# Patient Record
Sex: Male | Born: 1990
Health system: Southern US, Community
[De-identification: ages and names within clinical notes are randomized; demographics above are authoritative.]

## PROBLEM LIST (undated history)

## (undated) DIAGNOSIS — F840 Autistic disorder: Secondary | ICD-10-CM

---
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
## 2011-03-16 ENCOUNTER — Encounter (HOSPITAL_COMMUNITY): Payer: Medicaid Other | Admitting: Physician Assistant

## 2011-03-16 DIAGNOSIS — F909 Attention-deficit hyperactivity disorder, unspecified type: Secondary | ICD-10-CM

## 2011-03-26 NOTE — H&P (Signed)
NAME:  Adam Nichols              ACCOUNT NO.:  [id number]   MEDICAL RECORD NO.:  [id number]          PATIENT TYPE:  AMB   LOCATION:  DAY                           FACILITY:  [facility]   PHYSICIAN:  Ky Barban, M.D.DATE OF BIRTH:  Jun 06, 1991   DATE OF ADMISSION:  DATE OF DISCHARGE:  LH                                HISTORY & PHYSICAL   CHIEF COMPLAINT:  Urinary incontinence and frequency.   A 19-1/19-year-old child referred to me by Dr. Arita Miss. His main complaint is  that he is having a lot of frequency nocturia and denies any fevers, chills,  gross hematuria. Also has urge incontinence. He is mentally retarded, on  several medications which have been discontinued. Initially after  discontinuation of medicines, there was some improvement, but he is having  the same problem again. Renal ultrasound was done; it was normal. He is  being brought as an outpatient to undergo cystoscope under anesthesia as an  outpatient.   PAST HISTORY:  Otherwise negative.   FAMILY HISTORY:  Negative. He has another brother with the same problem who  is mentally retarded also.   REVIEW OF SYSTEMS:  Unremarkable.   PHYSICAL EXAMINATION:  GENERAL:  Moderately built, not in acute distress. It  is obvious that he is not mentally as developed.  CENTRAL NERVOUS SYSTEM:  Negative.  HEENT:  Negative.  CHEST:  Symmetrical.  HEART:  Regular sinus rhythm.  ABDOMEN:  Soft, flat. Liver, spleen and kidneys are not palpable.  EXTERNAL GENITALIA:  He is uncircumcised. Meatus adequate. Testicles are  normal.   IMPRESSION:  Urinary frequency, urgency.   PLAN:  Cysto under anesthesia as outpatient.       MIJ/MEDQ  D:  06/14/2005  T:  06/14/2005  Job:  [id number]

## 2011-03-26 NOTE — Op Note (Signed)
NAMEBREYLIN, DOM              ACCOUNT NO.:  [id number]   MEDICAL RECORD NO.:  [id number]          PATIENT TYPE:  AMB   LOCATION:  DAY                           FACILITY:  [facility]   PHYSICIAN:  Ky Barban, M.D.DATE OF BIRTH:  06/28/1991   DATE OF PROCEDURE:  06/15/2005  DATE OF DISCHARGE:                                 OPERATIVE REPORT   PREOPERATIVE DIAGNOSIS:  Urinary frequency and incontinence.   POSTOPERATIVE DIAGNOSIS:  Questionable interstitial cystitis.   PROCEDURE:  Cystoscopy.   ANESTHESIA:  General.   PROCEDURE NOTE:  The patient under general anesthesia adequately prepped and  draped in lithotomy position. A #17 cystoscope was introduced under direct  vision. Anterior urethra looks normal. Prostatic urethra is open, but it was  hyperemic. The bladder neck was opened. The bladder is smooth. There was no  evidence of any foreign body, tumor, or inflammation. Both orifices located  at normal site and are normal in shape. The __________ bladder mucosa. There  is questionable interstitial cystitis. The cystoscope was removed. The  patient left the procedure room in satisfactory condition.       MIJ/MEDQ  D:  06/15/2005  T:  06/15/2005  Job:  [id number]

## 2011-06-15 ENCOUNTER — Encounter (HOSPITAL_COMMUNITY): Payer: Medicaid Other | Admitting: Physician Assistant

## 2011-06-15 DIAGNOSIS — F909 Attention-deficit hyperactivity disorder, unspecified type: Secondary | ICD-10-CM

## 2011-09-15 ENCOUNTER — Encounter (HOSPITAL_COMMUNITY): Payer: Medicaid Other | Admitting: Physician Assistant

## 2011-09-27 ENCOUNTER — Other Ambulatory Visit (HOSPITAL_COMMUNITY): Payer: Self-pay

## 2011-09-28 ENCOUNTER — Ambulatory Visit (HOSPITAL_COMMUNITY): Payer: Medicaid Other | Admitting: Physician Assistant

## 2011-11-11 ENCOUNTER — Other Ambulatory Visit (HOSPITAL_COMMUNITY): Payer: Self-pay | Admitting: Physician Assistant

## 2011-11-11 DIAGNOSIS — F848 Other pervasive developmental disorders: Secondary | ICD-10-CM

## 2011-11-11 MED ORDER — OLANZAPINE 20 MG PO TBDP: 20.0000 mg | ORAL_TABLET | Freq: Three times a day (TID) | ORAL | Status: DC | PRN

## 2011-11-24 ENCOUNTER — Ambulatory Visit (INDEPENDENT_AMBULATORY_CARE_PROVIDER_SITE_OTHER): Payer: Medicaid Other | Admitting: Physician Assistant

## 2011-11-24 DIAGNOSIS — F909 Attention-deficit hyperactivity disorder, unspecified type: Secondary | ICD-10-CM

## 2011-11-24 DIAGNOSIS — F848 Other pervasive developmental disorders: Secondary | ICD-10-CM

## 2011-11-24 MED ORDER — DEXMETHYLPHENIDATE HCL ER 10 MG PO CP24: 10.0000 mg | ORAL_CAPSULE | Freq: Every day | ORAL | Status: DC

## 2011-11-30 NOTE — Progress Notes (Signed)
    Health Follow-up Outpatient Visit  Adam Nichols 25-Sep-1991  Date: 11/24/11   Subjective: Adam Nichols was seen with his mother and brother today. Mother reports that overall Adam Nichols has been doing well, but today he has been acting out. She has been giving him less focal and then prescribed, and he seems to be doing well.  There were no vitals filed for this visit.  Mental Status Examination  Appearance: Casual Alert: Yes Attention: poor Cooperative: No Eye Contact: Poor Speech: Absent Psychomotor Activity: Increased Memory/Concentration: Unknown Oriented: Unknown Mood: Anxious and Euthymic Affect: Congruent Thought Processes and Associations: Unknown Fund of Knowledge: Poor Thought Content:  Insight: Poor Judgement: Poor  Diagnosis: ADHD, combined type; autism  Treatment Plan: Lower dose of Focalin XR , continue other medications as prescribed, followup in 3 months.  Stephaniemarie Stoffel, PA

## 2011-12-14 ENCOUNTER — Other Ambulatory Visit (HOSPITAL_COMMUNITY): Payer: Self-pay | Admitting: Psychology

## 2011-12-14 DIAGNOSIS — F5105 Insomnia due to other mental disorder: Secondary | ICD-10-CM

## 2011-12-14 MED ORDER — TRAZODONE HCL 100 MG PO TABS: 100.0000 mg | ORAL_TABLET | Freq: Every day | ORAL | Status: DC

## 2011-12-29 ENCOUNTER — Other Ambulatory Visit (HOSPITAL_COMMUNITY): Payer: Self-pay | Admitting: *Deleted

## 2011-12-29 ENCOUNTER — Other Ambulatory Visit (HOSPITAL_COMMUNITY): Payer: Self-pay | Admitting: Physician Assistant

## 2011-12-29 DIAGNOSIS — F848 Other pervasive developmental disorders: Secondary | ICD-10-CM

## 2011-12-29 MED ORDER — OLANZAPINE 20 MG PO TBDP: 20.0000 mg | ORAL_TABLET | Freq: Three times a day (TID) | ORAL | Status: DC | PRN

## 2012-01-03 ENCOUNTER — Other Ambulatory Visit (HOSPITAL_COMMUNITY): Payer: Self-pay | Admitting: Physician Assistant

## 2012-01-03 DIAGNOSIS — F848 Other pervasive developmental disorders: Secondary | ICD-10-CM

## 2012-01-03 MED ORDER — OLANZAPINE 20 MG PO TBDP: 20.0000 mg | ORAL_TABLET | Freq: Three times a day (TID) | ORAL | Status: DC | PRN

## 2012-01-12 ENCOUNTER — Other Ambulatory Visit (HOSPITAL_COMMUNITY): Payer: Self-pay | Admitting: *Deleted

## 2012-01-12 DIAGNOSIS — F5105 Insomnia due to other mental disorder: Secondary | ICD-10-CM

## 2012-01-12 MED ORDER — TRAZODONE HCL 100 MG PO TABS: 100.0000 mg | ORAL_TABLET | Freq: Every day | ORAL | Status: DC

## 2012-02-03 ENCOUNTER — Other Ambulatory Visit (HOSPITAL_COMMUNITY): Payer: Self-pay | Admitting: *Deleted

## 2012-02-03 DIAGNOSIS — F848 Other pervasive developmental disorders: Secondary | ICD-10-CM

## 2012-02-03 MED ORDER — OLANZAPINE 20 MG PO TBDP: 20.0000 mg | ORAL_TABLET | Freq: Three times a day (TID) | ORAL | Status: DC | PRN

## 2012-02-16 ENCOUNTER — Other Ambulatory Visit (HOSPITAL_COMMUNITY): Payer: Self-pay | Admitting: *Deleted

## 2012-02-16 DIAGNOSIS — F5105 Insomnia due to other mental disorder: Secondary | ICD-10-CM

## 2012-02-16 MED ORDER — TRAZODONE HCL 100 MG PO TABS: 100.0000 mg | ORAL_TABLET | Freq: Every day | ORAL | Status: DC

## 2012-02-22 ENCOUNTER — Ambulatory Visit (INDEPENDENT_AMBULATORY_CARE_PROVIDER_SITE_OTHER): Payer: Medicaid Other | Admitting: Physician Assistant

## 2012-02-22 DIAGNOSIS — F840 Autistic disorder: Secondary | ICD-10-CM | POA: Insufficient documentation

## 2012-02-22 DIAGNOSIS — F909 Attention-deficit hyperactivity disorder, unspecified type: Secondary | ICD-10-CM | POA: Insufficient documentation

## 2012-02-22 MED ORDER — DEXMETHYLPHENIDATE HCL ER 10 MG PO CP24: 10.0000 mg | ORAL_CAPSULE | Freq: Every day | ORAL | Status: DC

## 2012-02-22 NOTE — Progress Notes (Signed)
   [facility] Follow-up Outpatient Visit  Adam Nichols Credit 1991-05-17  Date: 02/22/2012   Subjective: Adam Nichols presents today with his mother and brother to followup on his medications for ADHD and autism. Mother reports that he is doing well. He is having his best year at school. He is sleeping and eating well. His insurance may change to Medicare in May. There have been no behavioral problems.  There were no vitals filed for this visit.  Mental Status Examination  Appearance: Well groomed and casually dressed Alert: Yes Attention: fair  Cooperative: Yes Eye Contact: None Speech: Absent Psychomotor Activity: Normal Memory/Concentration: Undetermined Oriented: Undetermined Mood: Euthymic Affect: Congruent Thought Processes and Associations: Undetermined Fund of Knowledge: Poor Thought Content: Normal Insight: Poor Judgement: Fair  Diagnosis: ADHD, combined type; autism  Treatment Plan: We will continue his Focalin XR 10 mg each morning trazodone 100 mg at bedtime and Zyprexa Zydis 20 mg 3 times daily as needed. He will followup in 3 months.  Adam Lefebre, PA-C

## 2012-04-05 ENCOUNTER — Other Ambulatory Visit (HOSPITAL_COMMUNITY): Payer: Self-pay | Admitting: *Deleted

## 2012-04-05 DIAGNOSIS — F848 Other pervasive developmental disorders: Secondary | ICD-10-CM

## 2012-04-05 MED ORDER — OLANZAPINE 20 MG PO TBDP: 20.0000 mg | ORAL_TABLET | Freq: Three times a day (TID) | ORAL | Status: DC | PRN

## 2012-04-06 ENCOUNTER — Telehealth (HOSPITAL_COMMUNITY): Payer: Self-pay | Admitting: *Deleted

## 2012-04-06 NOTE — Telephone Encounter (Signed)
Left ZO:XWRUEA very worried about Adam Nichols's Olanzapine RX as was told by pharmacy that insurance will not pay for TID dosing. [facility] contacted for further details. Due to change in insurance, current insurance [[employer]] will only  provide 2 tablets a day-as per FDA guidelines.Prior Authorizations not accepted due to guideline. Patient will have a new insurance on 6/1 and they may cover medicine at TID dosing, but will have to wait until Monday to find out. Pharmacy will contact us/patient's mother to if prior authorization required. Contacted mother to inform her of above. She is also concerned that she received a letter and card informing her that all of Adam Nichols's medicines will now be filled at [facility]. She does not want to change pharmacy. Notified pharmacy of pt mother concerns. They will contact her.

## 2012-04-27 ENCOUNTER — Encounter (HOSPITAL_COMMUNITY): Payer: Self-pay | Admitting: *Deleted

## 2012-04-27 NOTE — Progress Notes (Signed)
Authorization for #90 tablets of Olanzapine ODT/30 days approved by [facility]. Authorized until 04/27/13. Patient's mother and [facility] notified

## 2012-05-01 ENCOUNTER — Other Ambulatory Visit (HOSPITAL_COMMUNITY): Payer: Self-pay | Admitting: *Deleted

## 2012-05-01 DIAGNOSIS — F5105 Insomnia due to other mental disorder: Secondary | ICD-10-CM

## 2012-05-01 MED ORDER — TRAZODONE HCL 100 MG PO TABS: 100.0000 mg | ORAL_TABLET | Freq: Every day | ORAL | Status: DC

## 2012-05-09 ENCOUNTER — Ambulatory Visit (INDEPENDENT_AMBULATORY_CARE_PROVIDER_SITE_OTHER): Payer: Medicaid Other | Admitting: Physician Assistant

## 2012-05-09 DIAGNOSIS — F909 Attention-deficit hyperactivity disorder, unspecified type: Secondary | ICD-10-CM

## 2012-05-09 DIAGNOSIS — F848 Other pervasive developmental disorders: Secondary | ICD-10-CM

## 2012-05-09 MED ORDER — DEXMETHYLPHENIDATE HCL ER 10 MG PO CP24: 10.0000 mg | ORAL_CAPSULE | Freq: Every day | ORAL | Status: DC

## 2012-05-09 NOTE — Progress Notes (Signed)
   [facility] Follow-up Outpatient Visit  Adam Nichols August 21, 1991  Date: 05/09/2012   Subjective: Adam Nichols presents today with his mother and brother to followup on treatment for ADHD and autism. Mother states about [name] "he's nothing but a big baby." She reports that his behavior has been good. He sleeps well and his appetite is good.  There were no vitals filed for this visit.  Mental Status Examination  Appearance: Fairly groomed and casually dressed Alert: Yes Attention: fair  Cooperative: Yes Eye Contact: Absent Speech: Absent Psychomotor Activity: Increased Memory/Concentration: Undetermined Oriented: Undetermined Mood: Euthymic Affect: Appropriate Thought Processes and Associations: Undetermined Fund of Knowledge: Poor Thought Content: Undetermined Insight: Poor Judgement: Fair  Diagnosis: ADHD, autism spectrum disorder  Treatment Plan: We will continue Focalin XR 10 mg daily, Zyprexa Zydis 20 mg 3 times daily as needed, and trazodone 100 mg at bedtime. He will followup in 3 months.  Dariah Mcsorley, PA-C

## 2012-06-02 ENCOUNTER — Other Ambulatory Visit (HOSPITAL_COMMUNITY): Payer: Self-pay | Admitting: Psychology

## 2012-06-02 DIAGNOSIS — F848 Other pervasive developmental disorders: Secondary | ICD-10-CM

## 2012-06-02 MED ORDER — OLANZAPINE 20 MG PO TBDP: 20.0000 mg | ORAL_TABLET | Freq: Three times a day (TID) | ORAL | Status: DC | PRN

## 2012-06-26 ENCOUNTER — Telehealth (HOSPITAL_COMMUNITY): Payer: Self-pay | Admitting: Psychiatry

## 2012-06-26 ENCOUNTER — Telehealth (HOSPITAL_COMMUNITY): Payer: Self-pay | Admitting: *Deleted

## 2012-06-26 ENCOUNTER — Other Ambulatory Visit (HOSPITAL_COMMUNITY): Payer: Self-pay | Admitting: Psychiatry

## 2012-06-26 NOTE — Telephone Encounter (Signed)
Called returned.  Mother is concerned about patient's behavior.  She has been giving Zyprexa 20 mg 4 times a day for past 2 days which is helping his agitation and anger.  She does not want to send him to [facility].  For past 2 days patient is doing somewhat better since taking more Zyprexa.  I recommend to decrease his Focalin to one a day and use fourth Zyprexa dose only as needed.  I also recommend to see Jorje Guild in few days if condition does not improve.  Mother like to schedule appointment in 10 days.  We will schedule appointment.

## 2012-06-26 NOTE — Telephone Encounter (Signed)
See phone note

## 2012-06-27 ENCOUNTER — Other Ambulatory Visit (HOSPITAL_COMMUNITY): Payer: Self-pay | Admitting: *Deleted

## 2012-07-05 ENCOUNTER — Telehealth (HOSPITAL_COMMUNITY): Payer: Self-pay | Admitting: Physician Assistant

## 2012-07-05 ENCOUNTER — Other Ambulatory Visit (HOSPITAL_COMMUNITY): Payer: Self-pay | Admitting: *Deleted

## 2012-07-05 ENCOUNTER — Other Ambulatory Visit (HOSPITAL_COMMUNITY): Payer: Self-pay | Admitting: Physician Assistant

## 2012-07-05 DIAGNOSIS — F5105 Insomnia due to other mental disorder: Secondary | ICD-10-CM

## 2012-07-05 MED ORDER — DIVALPROEX SODIUM 125 MG PO CPSP: 250.0000 mg | ORAL_CAPSULE | Freq: Every day | ORAL | Status: DC

## 2012-07-05 MED ORDER — TRAZODONE HCL 100 MG PO TABS: 100.0000 mg | ORAL_TABLET | Freq: Every day | ORAL | Status: DC

## 2012-07-05 NOTE — Telephone Encounter (Signed)
Returned and patient's mother's call concerning patient's aggressive behavior. Will start patient on Depakote sprinkles at bedtime and follow. Prescription sent to [facility].

## 2012-07-11 ENCOUNTER — Telehealth (HOSPITAL_COMMUNITY): Payer: Self-pay | Admitting: *Deleted

## 2012-07-11 ENCOUNTER — Encounter (HOSPITAL_COMMUNITY): Payer: Self-pay | Admitting: *Deleted

## 2012-07-11 NOTE — Telephone Encounter (Signed)
VM: Mother states Adam Nichols was absent from school August 26-30 due to behavioral problems that she spoke with Jorje Guild, PA about. She says the change in medicine helped a lot. He is sleeping better and having less outbursts. States she is going to let him try to attend school today. States she needs a letter for school for those absences. His teacher's name is Daron Offer, at [employer]. Their fax # [phone]

## 2012-07-11 NOTE — Telephone Encounter (Signed)
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 She states she will sign one when she is here for son's appointment in October so that future letters can be sent to the school. Also will sign for him to have medicine at school if needed at that time. She also wanted to inform Hessie Diener that current medication is still not lasting long enough. She states it wears off after 4 hours or so, when it used to last 7 hours. Mother also states his bedtime medicine is given at 8 pm and he wakes around 3 am every morning. She said she really needs some advice or another medicine change.

## 2012-07-18 ENCOUNTER — Telehealth (HOSPITAL_COMMUNITY): Payer: Self-pay | Admitting: *Deleted

## 2012-07-18 NOTE — Telephone Encounter (Signed)
Adam Nichols needs assistance with getting Adam Nichols his medicine at school at 8 am. She gives Adam Nichols at 4 am, 8 am and 12:30. They have changed the bus schedule, it no longer comes at 8:15am, it now will come at 6:45am. Adam Nichols needs to have his medicine given at school.

## 2012-07-18 NOTE — Telephone Encounter (Signed)
Told mother that we can fax a school medication administration form for [employer] to [facility] office signed by provider. She can pick it up there, sign it and send it to school with Carlo's medicine. She verbalized understanding.

## 2012-08-03 ENCOUNTER — Other Ambulatory Visit (HOSPITAL_COMMUNITY): Payer: Self-pay | Admitting: *Deleted

## 2012-08-03 DIAGNOSIS — F909 Attention-deficit hyperactivity disorder, unspecified type: Secondary | ICD-10-CM

## 2012-08-03 MED ORDER — DIVALPROEX SODIUM 125 MG PO CPSP: 250.0000 mg | ORAL_CAPSULE | Freq: Every day | ORAL | Status: DC

## 2012-08-04 ENCOUNTER — Other Ambulatory Visit (HOSPITAL_COMMUNITY): Payer: Self-pay

## 2012-08-04 DIAGNOSIS — F848 Other pervasive developmental disorders: Secondary | ICD-10-CM

## 2012-08-04 MED ORDER — OLANZAPINE 20 MG PO TBDP: 20.0000 mg | ORAL_TABLET | Freq: Three times a day (TID) | ORAL | Status: DC | PRN

## 2012-08-09 ENCOUNTER — Ambulatory Visit (HOSPITAL_COMMUNITY): Payer: Self-pay | Admitting: Physician Assistant

## 2012-08-16 ENCOUNTER — Other Ambulatory Visit (HOSPITAL_COMMUNITY): Payer: Self-pay

## 2012-08-16 ENCOUNTER — Ambulatory Visit (INDEPENDENT_AMBULATORY_CARE_PROVIDER_SITE_OTHER): Payer: Medicare Other | Admitting: Physician Assistant

## 2012-08-16 DIAGNOSIS — F909 Attention-deficit hyperactivity disorder, unspecified type: Secondary | ICD-10-CM

## 2012-08-16 DIAGNOSIS — F5105 Insomnia due to other mental disorder: Secondary | ICD-10-CM

## 2012-08-16 DIAGNOSIS — F840 Autistic disorder: Secondary | ICD-10-CM

## 2012-08-16 DIAGNOSIS — F848 Other pervasive developmental disorders: Secondary | ICD-10-CM

## 2012-08-16 MED ORDER — DEXMETHYLPHENIDATE HCL ER 10 MG PO CP24: 10.0000 mg | ORAL_CAPSULE | Freq: Every day | ORAL | Status: DC

## 2012-08-16 MED ORDER — OLANZAPINE 10 MG PO TBDP: ORAL_TABLET | ORAL | Status: DC

## 2012-08-16 MED ORDER — DEXMETHYLPHENIDATE HCL ER 10 MG PO CP24
10.0000 mg | ORAL_CAPSULE | Freq: Every day | ORAL | Status: DC
Start: 2012-08-16 — End: 2012-11-16

## 2012-08-16 MED ORDER — TRAZODONE HCL 100 MG PO TABS: 100.0000 mg | ORAL_TABLET | Freq: Every day | ORAL | Status: DC

## 2012-08-16 NOTE — Progress Notes (Signed)
   [facility] Follow-up Outpatient Visit  Adam Nichols 01-07-1991  Date: 08/16/2012   Subjective: Adam Nichols presents today with his mother to followup on his treatment for ADHD and autism. She reports that his medication wears off after 2-3 hours, and he gets up at 1 AM requiring more medication. Because of his behavior he has not been able to attend school.  There were no vitals filed for this visit.  Mental Status Examination  Appearance: Casual Alert: Yes Attention: fair  Cooperative: Yes Eye Contact: None Speech: Absent Psychomotor Activity: Restlessness Memory/Concentration: Undetermined Oriented: Undetermined Mood: Anxious and Irritable Affect: Congruent Thought Processes and Associations: Undetermined Fund of Knowledge: Poor Thought Content: Undetermined Insight: Poor Judgement: Poor  Diagnosis: Autism, ADHD  Treatment Plan: We will increase his Zyprexa Zydis so that he can have 20 mg daily plus an additional 10 mg at bedtime. We will continue his Focalin XR 10 mg 3 times daily., And his trazodone 100 mg at bedtime. He will return for followup in 3 months. We will consider transferring his care to [facility] where he may receive injectable antipsychotic medication.  Camauri Craton, PA-C

## 2012-08-22 ENCOUNTER — Telehealth (HOSPITAL_COMMUNITY): Payer: Self-pay

## 2012-08-24 ENCOUNTER — Telehealth (HOSPITAL_COMMUNITY): Payer: Self-pay | Admitting: *Deleted

## 2012-09-04 ENCOUNTER — Telehealth (HOSPITAL_COMMUNITY): Payer: Self-pay | Admitting: Physician Assistant

## 2012-09-04 NOTE — Telephone Encounter (Signed)
See phone notes.

## 2012-09-04 NOTE — Telephone Encounter (Signed)
Called patient's mother regarding confusion about administration of medications at school. Mother gave him the name of person at school to talk to. Contact with that person and 8 understand what needs to be done. Call mother back and explained that school understands.

## 2012-10-09 ENCOUNTER — Other Ambulatory Visit (HOSPITAL_COMMUNITY): Payer: Self-pay | Admitting: *Deleted

## 2012-10-09 DIAGNOSIS — F909 Attention-deficit hyperactivity disorder, unspecified type: Secondary | ICD-10-CM

## 2012-10-09 MED ORDER — DIVALPROEX SODIUM 125 MG PO CPSP: 250.0000 mg | ORAL_CAPSULE | Freq: Every day | ORAL | Status: DC

## 2012-11-16 ENCOUNTER — Ambulatory Visit (INDEPENDENT_AMBULATORY_CARE_PROVIDER_SITE_OTHER): Payer: Medicare Other | Admitting: Physician Assistant

## 2012-11-16 DIAGNOSIS — F840 Autistic disorder: Secondary | ICD-10-CM

## 2012-11-16 DIAGNOSIS — F909 Attention-deficit hyperactivity disorder, unspecified type: Secondary | ICD-10-CM

## 2012-11-16 MED ORDER — DEXMETHYLPHENIDATE HCL ER 10 MG PO CP24: 10.0000 mg | ORAL_CAPSULE | Freq: Every day | ORAL | Status: DC

## 2012-11-16 NOTE — Progress Notes (Signed)
   [facility] Follow-up Outpatient Visit  [place] Bessey 03-18-1991  Date: 10/16/2013   Subjective: Adam Nichols presents today with his brother and mother to followup on his treatment for autism and ADHD. Mother reports that the increased dose of Zyprexa at bedtime has been very helpful, and that Adam Nichols is doing very well now.  There were no vitals filed for this visit.  Mental Status Examination  Appearance: Casual Alert: Yes Attention: fair  Cooperative: Yes Eye Contact: None Speech: Absent Psychomotor Activity: Restlessness Memory/Concentration: Undetermined Oriented: Undetermined Mood: Euthymic Affect: Congruent Thought Processes and Associations: Undetermined Fund of Knowledge: Poor Thought Content: Undetermined Insight: Poor Judgement: Fair  Diagnosis: ADHD, combined type; autistic disorder.  Treatment Plan: We will continue his Focalin XR 10 mg each morning Zyprexa Zydis 20 mg each morning and 10 mg at bedtime, trazodone 100 mg at bedtime and Depakote sprinkles 250 mg at bedtime. He will return for followup in 3 months.  Adam Tremper, PA-C

## 2012-12-01 ENCOUNTER — Other Ambulatory Visit (HOSPITAL_COMMUNITY): Payer: Self-pay | Admitting: Physician Assistant

## 2012-12-01 ENCOUNTER — Telehealth (HOSPITAL_COMMUNITY): Payer: Self-pay

## 2012-12-01 DIAGNOSIS — F848 Other pervasive developmental disorders: Secondary | ICD-10-CM

## 2012-12-01 MED ORDER — OLANZAPINE 10 MG PO TBDP: ORAL_TABLET | ORAL | Status: DC

## 2012-12-12 ENCOUNTER — Other Ambulatory Visit (HOSPITAL_COMMUNITY): Payer: Self-pay | Admitting: *Deleted

## 2012-12-12 DIAGNOSIS — F909 Attention-deficit hyperactivity disorder, unspecified type: Secondary | ICD-10-CM

## 2012-12-12 DIAGNOSIS — F5105 Insomnia due to other mental disorder: Secondary | ICD-10-CM

## 2012-12-12 MED ORDER — TRAZODONE HCL 100 MG PO TABS: 100.0000 mg | ORAL_TABLET | Freq: Every day | ORAL | Status: DC

## 2012-12-12 MED ORDER — DIVALPROEX SODIUM 125 MG PO CPSP: 250.0000 mg | ORAL_CAPSULE | Freq: Every day | ORAL | Status: DC

## 2013-02-14 ENCOUNTER — Ambulatory Visit (INDEPENDENT_AMBULATORY_CARE_PROVIDER_SITE_OTHER): Payer: 59 | Admitting: Physician Assistant

## 2013-02-14 DIAGNOSIS — F840 Autistic disorder: Secondary | ICD-10-CM

## 2013-02-14 DIAGNOSIS — F909 Attention-deficit hyperactivity disorder, unspecified type: Secondary | ICD-10-CM

## 2013-02-14 MED ORDER — DEXMETHYLPHENIDATE HCL ER 10 MG PO CP24: 10.0000 mg | ORAL_CAPSULE | Freq: Every day | ORAL | Status: DC

## 2013-02-14 NOTE — Progress Notes (Signed)
   [facility] Follow-up Outpatient Visit  Adam Nichols 02-04-1991  Date: 02/14/13   Subjective: Adam Nichols presents today with his mother and brother to followup on his treatment for ADHD and autism. Mother reports that everyone is doing well. She reports that Adam Nichols continues to be rather outgoing, which at times can present problems.  There were no vitals filed for this visit.  Mental Status Examination  Appearance: Well groomed and casually dressed Alert: Yes Attention: good  Cooperative: Yes Eye Contact: Minimal Speech: None Psychomotor Activity: Normal Memory/Concentration: Undetermined Oriented: Undetermined  Mood: Euthymic  Affect: Congruent  Thought Processes and Associations: Undetermined  Fund of Knowledge: Poor  Thought Content: Undetermined  Insight: Poor  Judgement: Fair   Diagnosis: ADHD, combined type; autistic disorder.   Treatment Plan: We will continue his Focalin XR 10 mg each morning Zyprexa Zydis 20 mg each morning and 10 mg at bedtime, trazodone 100 mg at bedtime and Depakote sprinkles 250 mg at bedtime. He will return for followup in 3 months.  Delando Satter, PA-C

## 2013-03-15 ENCOUNTER — Other Ambulatory Visit (HOSPITAL_COMMUNITY): Payer: Self-pay | Admitting: *Deleted

## 2013-03-15 DIAGNOSIS — F5105 Insomnia due to other mental disorder: Secondary | ICD-10-CM

## 2013-03-15 DIAGNOSIS — F909 Attention-deficit hyperactivity disorder, unspecified type: Secondary | ICD-10-CM

## 2013-03-15 MED ORDER — TRAZODONE HCL 100 MG PO TABS: 100.0000 mg | ORAL_TABLET | Freq: Every day | ORAL | Status: DC

## 2013-03-15 MED ORDER — DIVALPROEX SODIUM 125 MG PO CPSP: 250.0000 mg | ORAL_CAPSULE | Freq: Every day | ORAL | Status: DC

## 2013-05-16 ENCOUNTER — Ambulatory Visit (HOSPITAL_COMMUNITY): Payer: Self-pay | Admitting: Physician Assistant

## 2013-05-18 ENCOUNTER — Telehealth (HOSPITAL_COMMUNITY): Payer: Self-pay

## 2013-05-18 ENCOUNTER — Other Ambulatory Visit (HOSPITAL_COMMUNITY): Payer: Self-pay | Admitting: Physician Assistant

## 2013-05-18 DIAGNOSIS — F909 Attention-deficit hyperactivity disorder, unspecified type: Secondary | ICD-10-CM

## 2013-05-18 DIAGNOSIS — F5105 Insomnia due to other mental disorder: Secondary | ICD-10-CM

## 2013-05-18 MED ORDER — TRAZODONE HCL 100 MG PO TABS: 100.0000 mg | ORAL_TABLET | Freq: Every day | ORAL | Status: DC

## 2013-05-18 MED ORDER — DIVALPROEX SODIUM 125 MG PO CPSP: 250.0000 mg | ORAL_CAPSULE | Freq: Every day | ORAL | Status: DC

## 2013-05-29 ENCOUNTER — Encounter (HOSPITAL_COMMUNITY): Payer: Self-pay | Admitting: Physician Assistant

## 2013-05-29 ENCOUNTER — Ambulatory Visit (INDEPENDENT_AMBULATORY_CARE_PROVIDER_SITE_OTHER): Payer: Medicare Other | Admitting: Physician Assistant

## 2013-05-29 VITALS — BP 122/85 | HR 103 | Ht 69.25 in | Wt 187.0 lb

## 2013-05-29 DIAGNOSIS — F909 Attention-deficit hyperactivity disorder, unspecified type: Secondary | ICD-10-CM

## 2013-05-29 DIAGNOSIS — F840 Autistic disorder: Secondary | ICD-10-CM

## 2013-05-29 MED ORDER — DEXMETHYLPHENIDATE HCL ER 10 MG PO CP24: 10.0000 mg | ORAL_CAPSULE | Freq: Every day | ORAL | Status: DC

## 2013-05-29 NOTE — Progress Notes (Signed)
   [facility] Follow-up Outpatient Visit  Auther Lyerly Tandy August 17, 1991  Date: 05/29/2013   Subjective: Adam Nichols presents today with Adam Nichols to followup on Adam treatment for ADHD and autism. Mother reports that he is doing very well. Adam behavior has been good. He is eating well. He is sleeping well.  Filed Vitals:   05/29/13 1352  BP: 122/85  Pulse: 103    Mental Status Examination  Appearance: casual Alert: Yes Attention: fair  Cooperative: Yes Eye Contact: Minimal Speech: absent Psychomotor Activity: Restlessness Memory/Concentration: undetermined Oriented: undetermined Mood: Euthymic Affect: Congruent Thought Processes and Associations: Undetermined Fund of Knowledge: Poor Thought Content: Undetermined Insight: Poor Judgement: Fair  Diagnosis: ADHD, combined type; autism  Treatment Plan: We will continue Adam Focalin XR 10 mg each morning Zyprexa Zydis 20 mg each morning and 10 mg at bedtime, trazodone 100 mg at bedtime and Depakote sprinkles 250 mg at bedtime. He will return for followup in 3 months.   Karah Caruthers, PA-C

## 2013-05-30 ENCOUNTER — Telehealth (HOSPITAL_COMMUNITY): Payer: Self-pay

## 2013-05-30 NOTE — Telephone Encounter (Signed)
05/30/13 2:54pm  Pt's mother called stating that Adam Nichols is filling out papers for school and she wanted him to know that the medication olanzapine is 20mg  at 8:30am../sh

## 2013-05-30 NOTE — Telephone Encounter (Signed)
Medication administration form faxed to school

## 2013-06-07 ENCOUNTER — Other Ambulatory Visit (HOSPITAL_COMMUNITY): Payer: Self-pay | Admitting: *Deleted

## 2013-06-07 DIAGNOSIS — F848 Other pervasive developmental disorders: Secondary | ICD-10-CM

## 2013-06-07 MED ORDER — OLANZAPINE 10 MG PO TBDP: ORAL_TABLET | ORAL | Status: DC

## 2013-06-08 ENCOUNTER — Telehealth (HOSPITAL_COMMUNITY): Payer: Self-pay

## 2013-08-07 ENCOUNTER — Other Ambulatory Visit (HOSPITAL_COMMUNITY): Payer: Self-pay | Admitting: Physician Assistant

## 2013-08-07 DIAGNOSIS — F840 Autistic disorder: Secondary | ICD-10-CM

## 2013-08-07 MED ORDER — OLANZAPINE 15 MG PO TBDP: 15.0000 mg | ORAL_TABLET | Freq: Three times a day (TID) | ORAL | Status: DC

## 2013-08-07 NOTE — Telephone Encounter (Signed)
See contact notes 

## 2013-08-08 ENCOUNTER — Encounter (HOSPITAL_COMMUNITY): Payer: Self-pay | Admitting: *Deleted

## 2013-08-08 NOTE — Progress Notes (Signed)
Prior Authorization obtained for Zyprexa Zydis 15 mg QID from [facility]. PA effective until 08/08/14. Mother and Pharmacy informed

## 2013-08-30 ENCOUNTER — Ambulatory Visit (INDEPENDENT_AMBULATORY_CARE_PROVIDER_SITE_OTHER): Payer: Medicare Other | Admitting: Physician Assistant

## 2013-08-30 ENCOUNTER — Encounter (HOSPITAL_COMMUNITY): Payer: Self-pay | Admitting: Physician Assistant

## 2013-08-30 VITALS — BP 137/83 | HR 98 | Ht 69.25 in | Wt 185.0 lb

## 2013-08-30 DIAGNOSIS — F840 Autistic disorder: Secondary | ICD-10-CM

## 2013-08-30 DIAGNOSIS — F909 Attention-deficit hyperactivity disorder, unspecified type: Secondary | ICD-10-CM

## 2013-08-30 MED ORDER — DEXMETHYLPHENIDATE HCL ER 10 MG PO CP24: 10.0000 mg | ORAL_CAPSULE | Freq: Every day | ORAL | Status: DC

## 2013-08-30 NOTE — Progress Notes (Signed)
   [facility] Follow-up Outpatient Visit  Adam Nichols Aug 13, 1991  Date:  08/30/2013   Subjective: Adam Nichols presents today to followup on his treatment for autism and ADHD. Mother reports that he has been doing well. She plans to take him out of school in June. There is another Adam Nichols at the school, and this Adam Nichols has been counted absent multiple times when it was actually the other Adam Nichols who was absent.  Filed Vitals:   08/30/13 1429  BP: 137/83  Pulse: 98    Mental Status Examination  Appearance: Casual Alert: Yes Attention: good  Cooperative: Yes Eye Contact: Absent Speech: Absent Psychomotor Activity: Normal Memory/Concentration: Undetermined Oriented: Undetermined Mood: Dysphoric Affect: Congruent Thought Processes and Associations: Undetermined Fund of Knowledge: Poor Thought Content: Undetermined Insight: Poor Judgement: Fair  Diagnosis: Autism, ADHD combined type  Treatment Plan: We will continue his Focalin XR 10 mg daily, Depakote sprinkles 250 mg at bedtime, Zyprexa desire this 15 mg 4 times daily, and trazodone 100 mg at bedtime. He will return for a followup visit with Dr. Tenny Craw in 3 months.  Adam Pareja, PA-C

## 2013-09-05 ENCOUNTER — Telehealth (HOSPITAL_COMMUNITY): Payer: Self-pay

## 2013-09-05 ENCOUNTER — Other Ambulatory Visit (HOSPITAL_COMMUNITY): Payer: Self-pay | Admitting: Physician Assistant

## 2013-09-05 DIAGNOSIS — F840 Autistic disorder: Secondary | ICD-10-CM

## 2013-09-05 MED ORDER — OLANZAPINE 15 MG PO TBDP: 15.0000 mg | ORAL_TABLET | Freq: Three times a day (TID) | ORAL | Status: DC

## 2013-09-06 ENCOUNTER — Other Ambulatory Visit (HOSPITAL_COMMUNITY): Payer: Self-pay | Admitting: *Deleted

## 2013-09-06 DIAGNOSIS — F840 Autistic disorder: Secondary | ICD-10-CM

## 2013-09-06 MED ORDER — OLANZAPINE 15 MG PO TBDP: 15.0000 mg | ORAL_TABLET | Freq: Three times a day (TID) | ORAL | Status: DC

## 2013-09-06 NOTE — Telephone Encounter (Signed)
Per Harrold Donath at pharmacy, RX for Olanzapine no received by phone on 09/05/13 Ordered electronically at this time

## 2013-09-11 ENCOUNTER — Encounter (HOSPITAL_COMMUNITY): Payer: Self-pay | Admitting: Emergency Medicine

## 2013-09-11 ENCOUNTER — Emergency Department (HOSPITAL_COMMUNITY): Payer: Medicare Other

## 2013-09-11 ENCOUNTER — Emergency Department (HOSPITAL_COMMUNITY)
Admission: EM | Admit: 2013-09-11 | Discharge: 2013-09-11 | Disposition: A | Discharge status: Home / Self Care | Payer: Medicare Other | Attending: Emergency Medicine | Admitting: Emergency Medicine

## 2013-09-11 DIAGNOSIS — IMO0002 Reserved for concepts with insufficient information to code with codable children: Secondary | ICD-10-CM | POA: Insufficient documentation

## 2013-09-11 DIAGNOSIS — R4689 Other symptoms and signs involving appearance and behavior: Secondary | ICD-10-CM

## 2013-09-11 DIAGNOSIS — F911 Conduct disorder, childhood-onset type: Secondary | ICD-10-CM | POA: Insufficient documentation

## 2013-09-11 DIAGNOSIS — F840 Autistic disorder: Secondary | ICD-10-CM | POA: Insufficient documentation

## 2013-09-11 DIAGNOSIS — Z79899 Other long term (current) drug therapy: Secondary | ICD-10-CM | POA: Insufficient documentation

## 2013-09-11 DIAGNOSIS — R1110 Vomiting, unspecified: Secondary | ICD-10-CM | POA: Insufficient documentation

## 2013-09-11 HISTORY — DX: Autistic disorder: F84.0

## 2013-09-11 LAB — CBC WITH DIFFERENTIAL/PLATELET
Basophils Absolute: 0 10*3/uL (ref 0.0–0.1)
Eosinophils Absolute: 0.3 10*3/uL (ref 0.0–0.7)
Eosinophils Relative: 5 % (ref 0–5)
HCT: 42.1 % (ref 39.0–52.0)
Lymphocytes Relative: 31 % (ref 12–46)
MCH: 31.1 pg (ref 26.0–34.0)
MCHC: 34.9 g/dL (ref 30.0–36.0)
MCV: 89 fL (ref 78.0–100.0)
Monocytes Absolute: 0.4 10*3/uL (ref 0.1–1.0)
Neutrophils Relative %: 55 % (ref 43–77)
RDW: 12.6 % (ref 11.5–15.5)
WBC: 5.1 10*3/uL (ref 4.0–10.5)

## 2013-09-11 LAB — COMPREHENSIVE METABOLIC PANEL
ALT: 28 U/L (ref 0–53)
Albumin: 4.3 g/dL (ref 3.5–5.2)
CO2: 26 mEq/L (ref 19–32)
Calcium: 9.6 mg/dL (ref 8.4–10.5)
Creatinine, Ser: 0.83 mg/dL (ref 0.50–1.35)
GFR calc Af Amer: >90 mL/min (ref >90)
GFR calc non Af Amer: >90 mL/min (ref >90)
Glucose, Bld: 120 mg/dL — ABNORMAL HIGH (ref 70–99)
Potassium: 4 mEq/L (ref 3.5–5.1)
Sodium: 139 mEq/L (ref 135–145)
Total Protein: 8 g/dL (ref 6.0–8.3)

## 2013-09-11 LAB — URINALYSIS, ROUTINE W REFLEX MICROSCOPIC
Bilirubin Urine: NEGATIVE
Hgb urine dipstick: NEGATIVE
Leukocytes, UA: NEGATIVE
Specific Gravity, Urine: <1.005 — ABNORMAL LOW (ref 1.005–1.030)
Urobilinogen, UA: 0.2 mg/dL (ref 0.0–1.0)
pH: 7 (ref 5.0–8.0)

## 2013-09-11 LAB — RAPID URINE DRUG SCREEN, HOSP PERFORMED
Amphetamines: NOT DETECTED
Cocaine: NOT DETECTED
Opiates: NOT DETECTED

## 2013-09-11 MED ORDER — TRAZODONE HCL 50 MG PO TABS: 100.0000 mg | ORAL_TABLET | Freq: Every day | ORAL | Status: DC

## 2013-09-11 MED ORDER — OLANZAPINE 5 MG PO TBDP
15.0000 mg | ORAL_TABLET | Freq: Three times a day (TID) | ORAL | Status: DC
  Filled 2013-09-11 (×4): qty 3

## 2013-09-11 MED ORDER — OLANZAPINE 10 MG PO TBDP
15.0000 mg | ORAL_TABLET | Freq: Three times a day (TID) | ORAL | Status: DC
  Administered 2013-09-11: 15 mg via ORAL

## 2013-09-11 MED ORDER — STERILE WATER FOR INJECTION IJ SOLN
INTRAMUSCULAR | Status: AC
  Administered 2013-09-11: 08:00:00
  Filled 2013-09-11: qty 10

## 2013-09-11 MED ORDER — HYDROXYZINE HCL 50 MG PO TABS: 50.0000 mg | ORAL_TABLET | Freq: Three times a day (TID) | ORAL | Status: DC | PRN

## 2013-09-11 MED ORDER — DIVALPROEX SODIUM 125 MG PO CPSP
250.0000 mg | ORAL_CAPSULE | Freq: Every day | ORAL | Status: DC
  Filled 2013-09-11: qty 2

## 2013-09-11 MED ORDER — DEXMETHYLPHENIDATE HCL ER 5 MG PO CP24
10.0000 mg | ORAL_CAPSULE | Freq: Every day | ORAL | Status: DC
  Administered 2013-09-11: 10 mg via ORAL

## 2013-09-11 MED ORDER — ZIPRASIDONE MESYLATE 20 MG IM SOLR
10.0000 mg | Freq: Once | INTRAMUSCULAR | Status: AC
  Administered 2013-09-11: 10 mg via INTRAMUSCULAR
  Filled 2013-09-11: qty 20

## 2013-09-11 MED ORDER — ONDANSETRON HCL 4 MG PO TABS: 4.0000 mg | ORAL_TABLET | Freq: Three times a day (TID) | ORAL | Status: DC | PRN

## 2013-09-11 NOTE — ED Notes (Signed)
Mother states that pt is becoming restless again. Was wanting to give pt his home medications, Dr. Manus Gunning notified, additional orders given, advised that it was ok to give pt his home medications.
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
## 2013-09-11 NOTE — [facility] Counselor (Signed)
Writer spoke w/ Vernona Rieger NP who will do TP at 1:30 PM. Writer notified RN Victorino Dike. Writer asked Victorino Dike to remind EDP Rancour to put in official order for telepsych.   Evette Cristal, [place] Assessment Counselor

## 2013-09-11 NOTE — ED Notes (Signed)
Pt lying on bed, mother at bedside, pt calmer, cooperative with exam at present, lights dimmed for comfort,
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
## 2013-09-11 NOTE — ED Notes (Signed)
telepsych being completed at present time,

## 2013-09-11 NOTE — ED Notes (Addendum)
Paige, from [facility] called and reported that pt telepsych would be at 1330.
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
## 2013-09-11 NOTE — ED Notes (Signed)
Per RPD pt's mother dropped pts prescriptions off at [facility] today and did not go back to pick them up. Pt is having aggressive behavior now and needs his meds. [facility] and rpd was taking his mother to pick them up.

## 2013-09-11 NOTE — ED Notes (Signed)
Called BH for TTS consult.  They will call back with an Appt time.
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
## 2013-09-11 NOTE — Consult Note (Signed)
Agree with plan 

## 2013-09-12 ENCOUNTER — Encounter (HOSPITAL_COMMUNITY): Payer: Self-pay | Admitting: Psychiatry

## 2013-09-12 ENCOUNTER — Ambulatory Visit (INDEPENDENT_AMBULATORY_CARE_PROVIDER_SITE_OTHER): Payer: Medicare Other | Admitting: Psychiatry

## 2013-09-12 VITALS — Ht 69.0 in | Wt 180.0 lb

## 2013-09-12 DIAGNOSIS — F840 Autistic disorder: Secondary | ICD-10-CM

## 2013-09-12 DIAGNOSIS — F5105 Insomnia due to other mental disorder: Secondary | ICD-10-CM

## 2013-09-12 DIAGNOSIS — F909 Attention-deficit hyperactivity disorder, unspecified type: Secondary | ICD-10-CM

## 2013-09-12 MED ORDER — HYDROXYZINE HCL 50 MG PO TABS: 50.0000 mg | ORAL_TABLET | Freq: Four times a day (QID) | ORAL | Status: DC

## 2013-09-12 MED ORDER — TRAZODONE HCL 100 MG PO TABS: ORAL_TABLET | ORAL | Status: DC
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
## 2013-09-17 ENCOUNTER — Emergency Department (HOSPITAL_COMMUNITY)
Admission: EM | Admit: 2013-09-17 | Discharge: 2013-09-26 | Disposition: A | Discharge status: Home / Self Care | Payer: Medicare Other | Attending: Emergency Medicine | Admitting: Emergency Medicine

## 2013-09-17 ENCOUNTER — Encounter (HOSPITAL_COMMUNITY): Payer: Self-pay | Admitting: Emergency Medicine

## 2013-09-17 ENCOUNTER — Telehealth (HOSPITAL_COMMUNITY): Payer: Self-pay | Admitting: *Deleted

## 2013-09-17 DIAGNOSIS — F909 Attention-deficit hyperactivity disorder, unspecified type: Secondary | ICD-10-CM | POA: Insufficient documentation

## 2013-09-17 DIAGNOSIS — F603 Borderline personality disorder: Secondary | ICD-10-CM

## 2013-09-17 DIAGNOSIS — F911 Conduct disorder, childhood-onset type: Secondary | ICD-10-CM | POA: Insufficient documentation

## 2013-09-17 DIAGNOSIS — F840 Autistic disorder: Secondary | ICD-10-CM | POA: Insufficient documentation

## 2013-09-17 DIAGNOSIS — IMO0002 Reserved for concepts with insufficient information to code with codable children: Secondary | ICD-10-CM | POA: Insufficient documentation

## 2013-09-17 DIAGNOSIS — Z79899 Other long term (current) drug therapy: Secondary | ICD-10-CM | POA: Insufficient documentation

## 2013-09-17 LAB — COMPREHENSIVE METABOLIC PANEL
ALT: 22 U/L (ref 0–53)
AST: 30 U/L (ref 0–37)
Albumin: 4.4 g/dL (ref 3.5–5.2)
Alkaline Phosphatase: 106 U/L (ref 39–117)
BUN: 15 mg/dL (ref 6–23)
CO2: 27 mEq/L (ref 19–32)
Calcium: 9.5 mg/dL (ref 8.4–10.5)
Chloride: 103 mEq/L (ref 96–112)
Creatinine, Ser: 0.92 mg/dL (ref 0.50–1.35)
GFR calc Af Amer: >90 mL/min (ref >90)
GFR calc non Af Amer: >90 mL/min (ref >90)
Glucose, Bld: 122 mg/dL — ABNORMAL HIGH (ref 70–99)
Potassium: 3.7 mEq/L (ref 3.5–5.1)
Sodium: 139 mEq/L (ref 135–145)
Total Bilirubin: 0.3 mg/dL (ref 0.3–1.2)
Total Protein: 7.9 g/dL (ref 6.0–8.3)

## 2013-09-17 LAB — RAPID URINE DRUG SCREEN, HOSP PERFORMED
Amphetamines: NOT DETECTED
Barbiturates: NOT DETECTED
Benzodiazepines: NOT DETECTED
Cocaine: NOT DETECTED
Opiates: NOT DETECTED
Tetrahydrocannabinol: NOT DETECTED

## 2013-09-17 LAB — CBC WITH DIFFERENTIAL/PLATELET
Basophils Absolute: 0 K/uL (ref 0.0–0.1)
Basophils Relative: 0 % (ref 0–1)
Eosinophils Absolute: 0.3 K/uL (ref 0.0–0.7)
Eosinophils Relative: 4 % (ref 0–5)
HCT: 43.2 % (ref 39.0–52.0)
Hemoglobin: 14.8 g/dL (ref 13.0–17.0)
Lymphocytes Relative: 32 % (ref 12–46)
Lymphs Abs: 2.3 K/uL (ref 0.7–4.0)
MCH: 30.8 pg (ref 26.0–34.0)
MCHC: 34.3 g/dL (ref 30.0–36.0)
MCV: 89.8 fL (ref 78.0–100.0)
Monocytes Absolute: 0.8 K/uL (ref 0.1–1.0)
Monocytes Relative: 11 % (ref 3–12)
Neutro Abs: 3.9 K/uL (ref 1.7–7.7)
Neutrophils Relative %: 54 % (ref 43–77)
Platelets: 310 K/uL (ref 150–400)
RBC: 4.81 MIL/uL (ref 4.22–5.81)
RDW: 13.1 % (ref 11.5–15.5)
WBC: 7.2 K/uL (ref 4.0–10.5)

## 2013-09-17 LAB — VALPROIC ACID LEVEL: Valproic Acid Lvl: 21.8 ug/mL — ABNORMAL LOW (ref 50.0–100.0)

## 2013-09-17 LAB — CK: Total CK: 615 U/L — ABNORMAL HIGH (ref 7–232)

## 2013-09-17 MED ORDER — DIVALPROEX SODIUM 125 MG PO CPSP
250.0000 mg | ORAL_CAPSULE | Freq: Every day | ORAL | Status: DC
  Administered 2013-09-18 – 2013-09-22 (×6): 250 mg via ORAL
  Filled 2013-09-17 (×6): qty 2

## 2013-09-17 MED ORDER — BENZTROPINE MESYLATE 1 MG PO TABS
ORAL_TABLET | ORAL | Status: AC
  Filled 2013-09-17: qty 1

## 2013-09-17 MED ORDER — TRAZODONE HCL 50 MG PO TABS: 100.0000 mg | ORAL_TABLET | Freq: Every day | ORAL | Status: DC

## 2013-09-17 MED ORDER — STERILE WATER FOR INJECTION IJ SOLN
INTRAMUSCULAR | Status: AC
  Filled 2013-09-17: qty 10

## 2013-09-17 MED ORDER — HYDROXYZINE HCL 25 MG PO TABS: 50.0000 mg | ORAL_TABLET | Freq: Three times a day (TID) | ORAL | Status: DC

## 2013-09-17 MED ORDER — DIPHENHYDRAMINE HCL 12.5 MG/5ML PO ELIX
25.0000 mg | ORAL_SOLUTION | Freq: Every day | ORAL | Status: DC | PRN
  Administered 2013-09-23: 25 mg via ORAL
  Filled 2013-09-17: qty 10

## 2013-09-17 MED ORDER — CLONAZEPAM 0.5 MG PO TABS
0.5000 mg | ORAL_TABLET | Freq: Three times a day (TID) | ORAL | Status: DC
  Administered 2013-09-17 – 2013-09-26 (×27): 0.5 mg via ORAL
  Filled 2013-09-17 (×26): qty 1

## 2013-09-17 MED ORDER — BENZTROPINE MESYLATE 1 MG PO TABS
1.0000 mg | ORAL_TABLET | Freq: Every day | ORAL | Status: DC
  Administered 2013-09-17 – 2013-09-26 (×10): 1 mg via ORAL
  Filled 2013-09-17 (×7): qty 1

## 2013-09-17 MED ORDER — OLANZAPINE 5 MG PO TBDP: 15.0000 mg | ORAL_TABLET | Freq: Three times a day (TID) | ORAL | Status: DC

## 2013-09-17 MED ORDER — CLONAZEPAM 0.5 MG PO TABS
ORAL_TABLET | ORAL | Status: AC
  Filled 2013-09-17: qty 1

## 2013-09-17 MED ORDER — ZIPRASIDONE MESYLATE 20 MG IM SOLR
20.0000 mg | Freq: Once | INTRAMUSCULAR | Status: DC
  Filled 2013-09-17 (×2): qty 20

## 2013-09-17 MED ORDER — TRAZODONE HCL 50 MG PO TABS
100.0000 mg | ORAL_TABLET | Freq: Every day | ORAL | Status: DC
  Administered 2013-09-18 – 2013-09-25 (×8): 100 mg via ORAL
  Filled 2013-09-17 (×8): qty 2

## 2013-09-17 MED ORDER — HYDROXYZINE HCL 25 MG PO TABS
50.0000 mg | ORAL_TABLET | Freq: Four times a day (QID) | ORAL | Status: DC
  Administered 2013-09-17: 50 mg via ORAL
  Filled 2013-09-17: qty 2

## 2013-09-17 MED ORDER — DEXMETHYLPHENIDATE HCL ER 5 MG PO CP24: 10.0000 mg | ORAL_CAPSULE | Freq: Every day | ORAL | Status: DC

## 2013-09-17 NOTE — ED Notes (Signed)
Placed an adult diaper on patient at this time, also placed a new pair of paper scrub pants on patient at this time, patient had torn his.

## 2013-09-17 NOTE — ED Notes (Signed)
Patient resting in bed with eyes closed. Did not wake up to give patient his medications. Will reassess at a later time.

## 2013-09-17 NOTE — BH Assessment (Signed)
Writer scheduled TTS for 1320. Contacted EDP- Raeford Razor prior to seeing this patient.

## 2013-09-17 NOTE — Consult Note (Signed)
Telepsych Consultation   Reason for Consult:  Behavioral changes Referring Physician:  Dr. Elease Hashimoto [name] is an 22 y.o. male.  Assessment: AXIS I:  Aggressive behavior,  AXIS II:  Autism spectrum disrder AXIS III:   Past Medical History  Diagnosis Date  . Autism    AXIS IV:  problems with primary support group AXIS V:  1-10 persistent dangerousness to self and others present  Plan: After discussion with Dr. Tenny Craw (local psychiatrist) and Dr. Lucianne Muss [facility] medical director) the following recommendations are made:  1. Recommend in patient admission to psychiatric facility that is proficient in treating Autism spectrum disorder.   TTS will seek placement. 2. Recommend discontinuation of zyprexa. 3. Recommend discontinuation of Focalin. 4. Recommend discontinuation of hydroxyzine. 5. Continue Depakote as ordered. 6. Recommend Cogentin 1mg  po or IM for prevention of EPS. 7. Klonopin 0.5 po TID. 8. Check CPK please to rule out NMS. 9.Continue Trazodone 100mg  at hs for sleep. Recommend 1:1 if mother needs to leave ED.  Subjective:   Adam Nichols is a 22 y.o. male patient admitted with aggressive behavioral changes over last 3 days.   HPI:  Patient is a non-verbal 22 year old AA male with severe autism spectrum disorder whose mother reports has had an aggressive change in his behaviors over the past 3 days. Medication changes recently recommended have not helped. She reports that he has not slept in 3 days and feels that the Focalin has been the problem. She states it was just restarted, however, she has been given prescriptions since July of this year for him.  HPI Elements:   Location:  [facility]. Quality:  severe. Severity:  patient is violent towards others. Timing:  worsening over the past week. Duration:  escalating over the past 3 days. Context:  patient is a danger toward others.  Past Psychiatric History: Past Medical History  Diagnosis Date  . Autism     reports  that he has never smoked. He does not have any smokeless tobacco history on file. He reports that he does not drink alcohol or use illicit drugs. Family History  Problem Relation Age of Onset  . Autism Brother    Family History Substance Abuse: No Family Supports: Yes, List: (mother ) Living Arrangements: Other (Comment) (lives withi mother and brother who is autistic) Can pt return to current living arrangement?: Yes Allergies:  No Known Allergies  ACT Assessment Complete:  Yes:    Educational Status    Risk to Self: Risk to self Suicidal Ideation: No Suicidal Intent: No Is patient at risk for suicide?: No Suicidal Plan?: No Access to Means: No What has been your use of drugs/alcohol within the last 12 months?:  (n/a) Previous Attempts/Gestures: No How many times?:  (0) Other Self Harm Risks:  (n/a) Triggers for Past Attempts: Other (Comment) (No previous attempts/gestures) Intentional Self Injurious Behavior: None (hitting hand on glass door ) Family Suicide History: No Recent stressful life event(s): Other (Comment) (patient is non verbal; mom not aware of any stressors ) Persecutory voices/beliefs?: No Depression: No Depression Symptoms:  (pt increasingly angry/irritable; ?? unk if related to depres) Substance abuse history and/or treatment for substance abuse?: No Suicide prevention information given to non-admitted patients: Not applicable  Risk to Others: Risk to Others Homicidal Ideation: No Thoughts of Harm to Others: No Current Homicidal Intent: No Current Homicidal Plan: No Access to Homicidal Means: No Describe Access to Homicidal Means:  (n/a) Identified Victim:  (n/a) History of harm  to others?: No Assessment of Violence: None Noted Violent Behavior Description:  (patient is calm and cooperative ) Does patient have access to weapons?: No Criminal Charges Pending?: No Does patient have a court date: No  Abuse: Abuse/Neglect Assessment (Assessment to be  complete while patient is alone) Physical Abuse: Denies Verbal Abuse: Denies Sexual Abuse: Denies Exploitation of patient/patient's resources: Denies Self-Neglect: Denies  Prior Inpatient Therapy: Prior Inpatient Therapy Prior Inpatient Therapy: No Prior Therapy Dates: na Prior Therapy Facilty/Provider(s): na Reason for Treatment: na  Prior Outpatient Therapy: Prior Outpatient Therapy Prior Outpatient Therapy: Yes Prior Therapy Dates: currently Prior Therapy Facilty/Provider(s): w/ [facility] & now [facility] Reason for Treatment: med management  Additional Information: Additional Information 1:1 In Past 12 Months?: No CIRT Risk: No Elopement Risk: Yes Does patient have medical clearance?: Yes   Objective: Blood pressure 148/78, pulse 115, temperature 99 F (37.2 C), temperature source Axillary, resp. rate 17, weight 83.915 kg (185 lb), SpO2 95.00%.Body mass index is 27.31 kg/(m^2). Results for orders placed during the hospital encounter of 09/17/13 (from the past 72 hour(s))  CBC WITH DIFFERENTIAL     Status: None   Collection Time    09/17/13 11:53 AM      Result Value Range   WBC 7.2  4.0 - 10.5 K/uL   RBC 4.81  4.22 - 5.81 MIL/uL   Hemoglobin 14.8  13.0 - 17.0 g/dL   HCT 16.1  09.6 - 04.5 %   MCV 89.8  78.0 - 100.0 fL   MCH 30.8  26.0 - 34.0 pg   MCHC 34.3  30.0 - 36.0 g/dL   RDW 40.9  81.1 - 91.4 %   Platelets 310  150 - 400 K/uL   Neutrophils Relative % 54  43 - 77 %   Neutro Abs 3.9  1.7 - 7.7 K/uL   Lymphocytes Relative 32  12 - 46 %   Lymphs Abs 2.3  0.7 - 4.0 K/uL   Monocytes Relative 11  3 - 12 %   Monocytes Absolute 0.8  0.1 - 1.0 K/uL   Eosinophils Relative 4  0 - 5 %   Eosinophils Absolute 0.3  0.0 - 0.7 K/uL   Basophils Relative 0  0 - 1 %   Basophils Absolute 0.0  0.0 - 0.1 K/uL  COMPREHENSIVE METABOLIC PANEL     Status: Abnormal   Collection Time    09/17/13 11:53 AM      Result Value Range   Sodium 139  135 - 145 mEq/L   Potassium 3.7  3.5 -  5.1 mEq/L   Chloride 103  96 - 112 mEq/L   CO2 27  19 - 32 mEq/L   Glucose, Bld 122 (*) 70 - 99 mg/dL   BUN 15  6 - 23 mg/dL   Creatinine, Ser 7.82  0.50 - 1.35 mg/dL   Calcium 9.5  8.4 - 95.6 mg/dL   Total Protein 7.9  6.0 - 8.3 g/dL   Albumin 4.4  3.5 - 5.2 g/dL   AST 30  0 - 37 U/L   ALT 22  0 - 53 U/L   Alkaline Phosphatase 106  39 - 117 U/L   Total Bilirubin 0.3  0.3 - 1.2 mg/dL   GFR calc non Af Amer >90  >90 mL/min   GFR calc Af Amer >90  >90 mL/min   Comment: (NOTE)     The eGFR has been calculated using the CKD EPI equation.  This calculation has not been validated in all clinical situations.     eGFR's persistently <90 mL/min signify possible Chronic Kidney     Disease.  VALPROIC ACID LEVEL     Status: Abnormal   Collection Time    09/17/13 11:53 AM      Result Value Range   Valproic Acid Lvl 21.8 (*) 50.0 - 100.0 ug/mL   Labs are reviewed and are pertinent for .  Current Facility-Administered Medications  Medication Dose Route Frequency Provider Last Rate Last Dose  . dexmethylphenidate (FOCALIN XR) 24 hr capsule 10 mg  10 mg Oral Daily Raeford Razor, MD      . diphenhydrAMINE (BENADRYL) 12.5 MG/5ML elixir 25 mg  25 mg Oral Daily PRN Raeford Razor, MD      . divalproex (DEPAKOTE SPRINKLE) capsule 250 mg  250 mg Oral QHS Raeford Razor, MD      . hydrOXYzine (ATARAX/VISTARIL) tablet 50 mg  50 mg Oral QID Raeford Razor, MD   50 mg at 09/17/13 1420  . OLANZapine zydis (ZYPREXA) disintegrating tablet 15 mg  15 mg Oral TID WC & HS Raeford Razor, MD      . traZODone (DESYREL) tablet 100 mg  100 mg Oral QHS Raeford Razor, MD      . ziprasidone (GEODON) injection 20 mg  20 mg Intramuscular Once Raeford Razor, MD       Current Outpatient Prescriptions  Medication Sig Dispense Refill  . dexmethylphenidate (FOCALIN XR) 10 MG 24 hr capsule Take 1 capsule (10 mg total) by mouth daily.  30 capsule  0  . diphenhydrAMINE (BENADRYL) 12.5 MG/5ML elixir Take 25 mg by mouth daily as  needed for sleep.      . divalproex (DEPAKOTE SPRINKLES) 125 MG capsule Take 2 capsules (250 mg total) by mouth at bedtime.  180 capsule  1  . hydrOXYzine (ATARAX/VISTARIL) 50 MG tablet Take 50 mg by mouth 3 (three) times daily.      Marland Kitchen olanzapine zydis (ZYPREXA) 15 MG disintegrating tablet Take 1 tablet (15 mg total) by mouth 4 (four) times daily -  with meals and at bedtime.  120 tablet  0  . traZODone (DESYREL) 100 MG tablet Take 100 mg by mouth at bedtime. Take two in bedtime        Psychiatric Specialty Exam:     Blood pressure 148/78, pulse 115, temperature 99 F (37.2 C), temperature source Axillary, resp. rate 17, weight 83.915 kg (185 lb), SpO2 95.00%.Body mass index is 27.31 kg/(m^2).  General Appearance: on gurney in the ED rocking back and forth  Eye Contact::  None  Speech:  patient is non-verbal  Volume:  NA  Mood:  agitated  Affect:  Flat  Thought Process:  NA  Orientation:  NA  Thought Content:  NA  Suicidal Thoughts:    Homicidal Thoughts:    Memory:    Judgement:    Insight:    Psychomotor Activity:  Restlessness and TD  Concentration:  NA  Recall:  NA  Akathisia:  Yes  Handed:    AIMS (if indicated):     Assets:  Social Support  Sleep:      Treatment Plan Summary: Admit to appropriate psychiatric facility for safety and medication management  Disposition:  After discussion with Dr. Tenny Craw (local psychiatrist) and Dr. Lucianne Muss [facility] medical director) the following recommendations are made:  1. Recommend in patient admission to psychiatric facility that is proficient in treating Autism spectrum disorder.   TTS will seek placement.  2. Recommend discontinuation of zyprexa. 3. Recommend discontinuation of Focalin. 4. Recommend discontinuation of hydroxyzine. 5. Continue Depakote as ordered 250mg  po daily at hs. 6. Recommend Cogentin 1mg  po or IM for prevention of     EPS daily.  7. Klonopin 0.5 po TID for agitation and aggression. 8.Continue Trazodone 100mg  at  hs for sleep. 9.Check CPK please to rule out NMS. 10. Continue to r/o infective process when patient is more cooperative. Recommend 1:1 if mother needs to leave ED.  The above information will be communicated directly to EDMD Dr. Devoria Albe.  Thank you for allowing [place] to participate in the care of this patient. Rona Ravens. Najat Olazabal RPAC 4:53 PM 09/17/2013

## 2013-09-17 NOTE — BH Assessment (Signed)
[facility] Assessment Progress Note  Per Verne Spurr, PA, pt presents a danger to self and others, for which psychiatric hospitalization in indicated.  Given his violence and acuity, as well as his special needs related to autistic spectrum disorder and intellectual functioning, she has declined him for admission to [facility].  At 17:22 I spoke to [place], MHT, asking her to seek placement, and recommending that she start by contacting [facility] and [employer], as well as [facility] and [facility].  Failing this, pt is to be referred to [facility].  Doylene Canning, MA Triage Specialist 09/17/2013 @ 17:26
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
## 2013-09-17 NOTE — ED Notes (Signed)
Questioned Dr. Juleen China re: patient being on Focalin XR w/autism dx.  Order d/c'ed.

## 2013-09-17 NOTE — BH Assessment (Signed)
Assessment Note  Adam Nichols is an 22 y.o. male with a h/o of autism who presents to the Emergency Department. Mom however sts that the autism diagnosis is incorrect. 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 Today she brings him to the ER referred by patient's current psychiatrist-Dr. Diannia Ruder at [facility] out patient [place] office.  Mom provides information as patient is non verbal. She reports aggressive behavior over the past week. Says that patient's behavior is escalating and has become violent. At baseline, he is "lovable" and likes to hug and kiss. He is nonverbal.  She states that since the symptoms started one week ago he has been pushing her off of him, keeping his distance, and not allowing anyone to touch him. She also states that he has been attacking his brother who is also diagnosed with autism. He has never done this before.  Additionally patient is breaking things around their home and was hitting his hand on a glass door yesterday.  He has been more violent at school evidenced by pushing schoolmates and teachers. Mom states that he is also having repetitive, agitated hand movements which is a new behavior for him. She has also mentioned that he makes a new squelling noise.  Mom was in the ED 09/11/2013 at [facility] last week with all of the above same complaints. However, sts that his behaviors are worse.  Patient is unable to confirm or deny SI/ HI as he is non verbal. Mom notes that b/c patient has pushed her she is afraid for her life and "something must be done. Mom has noticed no signs of AVH's. Patient is unable to confirm/deny this information.   Patient has no history of inpatient hospitalization. He has only received outpatient services. He was seen in the ED on 09/11/13 for the same and was started on Vistaril and had his Zyprexa dosage increased. Mom has faxed over patient's medication regimen..(information given to the Extender Verne Spurr, NP for review).   Disposition pending an evaluation by extender-Neil Mashburn, NP.     Axis I: Mood Disorder NOS Axis II: Autistic Disorder  Axis III:  Past Medical History  Diagnosis Date  . Autism    Axis IV: other psychosocial or environmental problems and problems with access to health care services Axis V: 31-40 impairment in reality testing  Past Medical History:  Past Medical History  Diagnosis Date  . Autism     History reviewed. No pertinent past surgical history.  Family History:  Family History  Problem Relation Age of Onset  . Autism Brother     Social History:  reports that he has never smoked. He does not have any smokeless tobacco history on file. He reports that he does not drink alcohol or use illicit drugs.  Additional Social History:  Alcohol / Drug Use Pain Medications: SEE MAR Prescriptions: SEE MAR Over the Counter: SEE MAR History of alcohol / drug use?: No history of alcohol / drug abuse  CIWA: CIWA-Ar BP: 148/78 mmHg Pulse Rate: 115 COWS:    Allergies: No Known Allergies  Home Medications:  (Not in a hospital admission)  OB/GYN Status:  No LMP for male patient.  General Assessment Data Location of Assessment: [facility] ED Is this a Tele or Face-to-Face Assessment?: Tele Assessment Is this an Initial Assessment or a Re-assessment for this encounter?: Initial Assessment Living Arrangements: Other (Comment) (lives withi mother and brother who is autistic) Can pt return to current living  arrangement?: Yes Admission Status: Voluntary Is patient capable of signing voluntary admission?: Yes Transfer from: Acute Hospital Referral Source: Self/Family/Friend     [facility] Crisis Care Plan Living Arrangements: Other (Comment) (lives withi mother and brother who is autistic) Name of Psychiatrist:  (Dr. 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 ) Highest grade of school patient has completed: 45 Name of school: [employer]  Risk to self Suicidal Ideation: No Suicidal Intent: No Is patient at risk for suicide?: No Suicidal Plan?: No Access to Means: No What has been your use of drugs/alcohol within the last 12 months?:  (n/a) Previous Attempts/Gestures: No How many times?:  (0) Other Self Harm Risks:  (n/a) Triggers for Past Attempts: Other (Comment) (No previous attempts/gestures) Intentional Self Injurious Behavior: None (hitting hand on glass door ) Family Suicide History: No Recent stressful life event(s): Other (Comment) (patient is non verbal; mom not aware of any stressors ) Persecutory voices/beliefs?: No Depression: No Depression Symptoms:  (pt increasingly angry/irritable; ?? unk if related to depres) Substance abuse history and/or treatment for substance abuse?: No Suicide prevention information given to non-admitted patients: Not applicable  Risk to Others Homicidal Ideation: No Thoughts of Harm to Others: No Current Homicidal Intent: No Current Homicidal Plan: No Access to Homicidal Means: No Describe Access to Homicidal Means:  (n/a) Identified Victim:  (n/a) History of harm to others?: No Assessment of Violence: None Noted Violent Behavior Description:  (patient is calm and cooperative ) Does patient have access to weapons?: No Criminal Charges Pending?: No Does patient have a court date: No  Psychosis Hallucinations: None noted Delusions: None noted  Mental Status Report Appear/Hygiene: Disheveled Eye Contact: Poor Motor Activity: Freedom of movement;Agitation Speech: Other (Comment) (pt is non verbal and this is his baseline) Level of Consciousness: Restless Mood: Anxious Affect: Preoccupied;Unable to Assess (difficult to assess as patient has a DD dx's) Anxiety Level: None Thought Processes:  (unable to determine; pt is  non verbal) Judgement: Impaired Orientation: Unable to assess;Other (Comment) (pt is non verbal due to itellectual disability) Obsessive Compulsive Thoughts/Behaviors: None  Cognitive Functioning Concentration: Decreased Memory: Recent Intact;Remote Intact IQ: Average Insight: Poor Impulse Control: Poor Appetite: Poor (per mom x2 days no food; pt ate all food today in hospital ) Weight Loss:  (none reported ) Weight Gain:  (none reported) Sleep: Decreased Total Hours of Sleep:  (mom sts patient has not ate in 2 days ) Vegetative Symptoms: None  ADLScreening [facility] Assessment Services) Patient's cognitive ability adequate to safely complete daily activities?: No (reportedly pt is autistic; mom says he is profoundly MR) Patient able to express need for assistance with ADLs?: No Independently performs ADLs?: No (per med hx pt is autistic; mom says he is profoundly MR)  Prior Inpatient Therapy Prior Inpatient Therapy: No Prior Therapy Dates: na Prior Therapy Facilty/Provider(s): na Reason for Treatment: na  Prior Outpatient Therapy Prior Outpatient Therapy: Yes Prior Therapy Dates: currently Prior Therapy Facilty/Provider(s): w/ [facility] & now [facility] Reason for Treatment: med management  ADL Screening (condition at time of admission) Patient's cognitive ability adequate to safely complete daily activities?: No (reportedly pt is autistic; mom says he is profoundly MR) Is the patient deaf or have difficulty hearing?: No Does the patient have difficulty seeing, even when wearing glasses/contacts?: No Does the patient have  difficulty concentrating, remembering, or making decisions?: No Patient able to express need for assistance with ADLs?: No Does the patient have difficulty dressing or bathing?: No Independently performs ADLs?: No (per med hx pt is autistic; mom says he is profoundly MR) Communication: Independent Is this a change from baseline?: Pre-admission  baseline Dressing (OT): Independent Grooming: Independent Feeding: Independent Bathing: Independent Toileting: Independent In/Out Bed: Independent Walks in Home: Independent Does the patient have difficulty walking or climbing stairs?: No Weakness of Legs: None Weakness of Arms/Hands: None  Home Assistive Devices/Equipment Home Assistive Devices/Equipment: None    Abuse/Neglect Assessment (Assessment to be complete while patient is alone) Physical Abuse: Denies Verbal Abuse: Denies Sexual Abuse: Denies Exploitation of patient/patient's resources: Denies Self-Neglect: Denies Values / Beliefs Cultural Requests During Hospitalization: None Spiritual Requests During Hospitalization: None   Advance Directives (For Healthcare) Advance Directive: Patient does not have advance directive Nutrition Screen- [facility] Adult/WL/[facility] Patient's home diet: Regular  Additional Information 1:1 In Past 12 Months?: No CIRT Risk: No Elopement Risk: Yes Does patient have medical clearance?: Yes     Disposition:  Disposition Initial Assessment Completed for this Encounter: Yes Disposition of Patient: Other dispositions (Pending evaluation by extender-Neil Mashburn, NP ) Other disposition(s): Other (Comment) (Pending a evaluation by Jobe Marker for disposition rec)  On Site Evaluation by:   Reviewed with Physician:    Melynda Ripple [facility] 09/17/2013 3:05 PM

## 2013-09-17 NOTE — Telephone Encounter (Signed)
Pt has been out of control.Scheduled Meds not working. She will bring him him to ER.for admission, Triage notified

## 2013-09-17 NOTE — ED Notes (Signed)
Patient arrives with mother with c/o medical clearance for [facility]. Patient with autism and has been acting out. Mother reports she is unable to care for him at home. Patient appears restless and agitated, but not acting out. Follows simple commands.

## 2013-09-17 NOTE — Progress Notes (Signed)
Writer was asked to seek placement for pt.Writer faxed referral to [facility],  [facility] and [facility]. Writer waiting on call back for acceptance. Pt. Was denied at [facility], pt criteria was not accepted. Shanda Bumps Jarrett Chicoine,MHT

## 2013-09-17 NOTE — ED Provider Notes (Signed)
16:37 Adam Nichols called, states she is going to discuss with Dr Lucianne Muss. Patient has severe autism and appears to be having some aggressive episodes.  16:54 Adam Nichols called back, states she has talked to patient's psychiatrist Dr Tenny Craw and Dr Lucianne Muss who recommend inpatient admission. Wants CK done to rule out NMS despite normal temperature. She also recommends stopping his Zyprexa, Focalin, and hydroxyzine. Continue Depakote and trazodone as written. She also states to start Cogentin 1 mg daily with Klonopin 0.5 mg 3 times a day. These orders were done.  Devoria Albe, MD, FACEP   Ward Givens, MD 09/17/13 [phone]

## 2013-09-17 NOTE — ED Notes (Signed)
Patient has strong startle w/any tactile stimulation.  Jumps up from bed and tries to exit room.  Blocked exit and patient returned to room, settling down.  Informed mother I would use the Geodon order only if patient was not able to be redirected.

## 2013-09-17 NOTE — ED Notes (Signed)
Patient is calm at present, eating dinner.

## 2013-09-17 NOTE — BH Assessment (Signed)
Received a call from patient's provider. Stating that patient would be brought to [facility] in hopes of getting inpatient placement. Says that patient was at [facility] and discharged last week. She sts, "I don't think he should be discharge again" and has worsened over the weekend. She sts that patient is "out of control". Says patient is diagnosed with autism. He is  hitting his mother, throwing objects, etc. She asked if the hospital had extra security stating, "You guys may need it".   Writer notified charge nurse at [place] of the above information.  

## 2013-09-18 MED ORDER — ZIPRASIDONE MESYLATE 20 MG IM SOLR
10.0000 mg | Freq: Once | INTRAMUSCULAR | Status: AC
  Administered 2013-09-18: 10 mg via INTRAMUSCULAR

## 2013-09-18 MED ORDER — STERILE WATER FOR INJECTION IJ SOLN
INTRAMUSCULAR | Status: AC
  Administered 2013-09-18: 21:00:00
  Filled 2013-09-18: qty 10

## 2013-09-18 MED ORDER — HALOPERIDOL 5 MG PO TABS
5.0000 mg | ORAL_TABLET | Freq: Once | ORAL | Status: AC
  Administered 2013-09-18: 5 mg via ORAL
  Filled 2013-09-18: qty 1

## 2013-09-18 NOTE — ED Provider Notes (Signed)
Pt now becoming aggressive, agitated, walking around the ED Will need further monitoring as well as medications geodon ordered (he had already been given haldol orally without improvement)  CRITICAL CARE Performed by: Joya Gaskins Total critical care time: 31 Critical care time was exclusive of separately billable procedures and treating other patients. Critical care was necessary to treat or prevent imminent or life-threatening deterioration. Critical care was time spent personally by me on the following activities: development of treatment plan with patient and/or surrogate as well as nursing, discussions with consultants, evaluation of patient's response to treatment, examination of patient, obtaining history from patient or surrogate, ordering and performing treatments and interventions, ordering and review of laboratory studies, ordering and review of radiographic studies, pulse oximetry and re-evaluation of patient's condition.   Joya Gaskins, MD 09/18/13 2112

## 2013-09-18 NOTE — ED Notes (Signed)
Phone call from [facility]  Received. Stated they are still in the process of getting patient assigned a bed.

## 2013-09-18 NOTE — ED Notes (Signed)
Patient up to bathroom with sitter at side.  

## 2013-09-18 NOTE — Progress Notes (Addendum)
Received call back number for [facility] on TTS voicemail, returned call to facility. Spoke with Fayrene Fearing who was unable to locate pt. Referral, and stated that he would call back with referral update. - Rodman Pickle, MHT  Contacted [facility] to follow up on pt. Referral. No answer. Debbe Odea Eastin Swing,MHT  [facility] to follow up on pt. Referral. No answer.- Herbert Seta

## 2013-09-18 NOTE — ED Notes (Signed)
Pt's mother called and reported that pt will need assistance with bathing and brushing teeth.  Notified NT and was told she would assist.

## 2013-09-18 NOTE — ED Provider Notes (Signed)
Pt stable, awaiting placement No issues at this time BP 129/76  Pulse 90  Temp(Src) 97 F (36.1 C) (Axillary)  Resp 18  Wt 185 lb (83.915 kg)  SpO2 98%   Joya Gaskins, MD 09/18/13 1525

## 2013-09-18 NOTE — ED Notes (Signed)
Patient took all his medication without any problems with applesauce. Ate half the bowl of applesauce.

## 2013-09-18 NOTE — Consult Note (Signed)
Case discussed, recommendations made by me. Patient needs inpatient hospitalization

## 2013-09-18 NOTE — ED Notes (Signed)
Family at bedside with pt.

## 2013-09-18 NOTE — ED Notes (Signed)
patienty to restroom with sitter at side

## 2013-09-18 NOTE — ED Notes (Signed)
Patient in room and has ripped his blue paper scrubs x2

## 2013-09-18 NOTE — ED Notes (Signed)
Pt resting, eyes closed, visible chest rise and fall.  Sitter at bedside.  Breakfast tray in room.

## 2013-09-19 MED ORDER — TRAZODONE HCL 50 MG PO TABS
ORAL_TABLET | ORAL | Status: AC
  Filled 2013-09-19: qty 1

## 2013-09-19 NOTE — ED Notes (Signed)
Patient sleeping; awakened to take medication and closed eyes back.  Respirations even and unlabored; equal rise and fall of chest noted.

## 2013-09-19 NOTE — BH Assessment (Signed)
Melodie Moore from [facility] called to verify if pt is still in the ED. Stated that [facility] care coordination will help with placement.  -Dossie Arbour, MA  Mental Health Tech

## 2013-09-19 NOTE — Progress Notes (Signed)
B.Greenly Rarick, MHT was requested to continue placement search efforts as well as follow up with previous referrals that have been submitted for review. Writer contacted [facility] spoke with Jacki Cones who reports a decline due to inability to except patients with Profound MR/Severe, patient is non verbal. [facility] has declined due to no program available, [facility] has declined due to no program for Autism and Profound MR. Writer made multiple attempts to contact [facility] with no success disposition still pending review. Writer contacted [facility] in efforts to pursue efforts to complete worksheet for request exceptions to Diversion which has been completed and information provided to clinician Sharon Seller who currently reports that patient is not meeting criteria for Diversion and will be providing this information to her supervisor Lisabeth Pick for additional review. [facility] has been given contact numbers to report back to Anusha Claus at [facility] ext [phone] or [facility] after 7am at ext [phone].  Writer spoke with patients mother prior to placement search efforts and received collateral information regarding onset of behaviors. Patients mother reports no known triggers and that the behaviors have become increasingly worse for the past 2 weeks while at school and in the home. Additionally, the mother reports that she winged patient off of his Focalin at her on decision and did not report this to his primary physician and afterwards started patient back on Focalin at onset of behaviors. Mother reports that there has been no other significant changes with in the home that she feels would have contributed to his behaviors.  Writer made referral to [facility] by contacting Margarita Sermons at [phone] on  09/19/13 at 1am. Margarita Sermons has been faxed assessment information regarding patient and has agreed to complete assessment with patient on 09/19/13 while patient is at [facility].

## 2013-09-19 NOTE — ED Notes (Signed)
Rutha Bouchard from [facility] to see the pt for consultation.   She does not recommend psychiatric hospitalization at this time.

## 2013-09-19 NOTE — ED Notes (Signed)
Pt given am snack, graham crackers, peanut butter and sprite. Sitter at bedside.

## 2013-09-19 NOTE — ED Provider Notes (Signed)
Pt now more calm at this time Awaiting placement   Joya Gaskins, MD 09/19/13 [phone]

## 2013-09-19 NOTE — ED Notes (Signed)
Patient lying in bed with eyes closed

## 2013-09-19 NOTE — ED Notes (Signed)
Pt able to feed self. Sitter at bedside.

## 2013-09-20 NOTE — ED Notes (Signed)
Pt resting quietly.  Remains cooperative.  No distress noted.

## 2013-09-20 NOTE — ED Notes (Signed)
Called Windell Moulding back from [facility] and gave update on pt status.

## 2013-09-20 NOTE — ED Provider Notes (Signed)
Talked to Adam Nichols from [facility]. States [facility] states patient does not meed inpatient criteria. They also are concerned his Depakote level is subtherapeutic. Has talked to [facility], his [facility] and they will be meeting today and will get his mother a care coordinator to help her get more services at home. Advised [facility] needs to be managing his Depakote dosing, his level today is still subtherapeutic, he will have them evaluate his medications.    Devoria Albe, MD, FACEP   Ward Givens, MD 09/20/13 1003

## 2013-09-20 NOTE — Progress Notes (Signed)
B.Shuntia Exton, MHT provided follow up with plan of care for patient who is 21 year old non verbal male. Writer spoke with Di Kindle at [facility] who reports that per Rutha Bouchard from [facility] patient does not meet inpatient criteria. Tresa Endo also reports that patients Depakote level is sub therapeutic and needs to be managed. [facility] at this time cannot except patient for care but will keep referral. Laurel from [facility] states that [facility] plans to meet with team on today and discuss care coordination. Writer spoke with Dr. Devoria Albe attending at [facility] and provided this update. Dr. Lynelle Doctor states that [facility] should be managing patients medications and recommends this take place as patient's Depakote level remains sub therapeutic which is at 34.6 and needs to be at 50 and above. Writer informed Torrie at [facility] of this request and she will initiate medication management needs. Writer currently waiting on response from [facility] as to there proposed plan of care regarding care coordination. Writer at this time has exhausted all placement efforts due to patient not meeting inpatient criteria and pending discharge when patients Depakote level is within recommended therapeutic range.

## 2013-09-20 NOTE — ED Notes (Signed)
Social worker at pt bedside inquiring about pt status and plan of care. Social worker informed that pt being calm and cooperative with staff.Social worker reported would inform pt mother of plan of care and pt status.

## 2013-09-20 NOTE — ED Notes (Signed)
Returned call to Windell Moulding from [employer]. No answer.Voicemail left with callback number.

## 2013-09-20 NOTE — ED Notes (Signed)
1000 am med's not available. Pharm aware

## 2013-09-20 NOTE — ED Provider Notes (Signed)
7:52 AM-Nonverbal patient with autism brought in by mother for "acting out."  Mother reports she is unable to care for him at home. Pt did become agitated when he first came to the ED. Pt reported as sleeping all night from 11 pm to 6 am this morning.  Pt has been declined at multiple facilities because of his autism and he is nonverbal. Pt ate breakfast today, he is ambulatory to the bathroom without assistance.   Repeat valproic acid level was 34.6 which is still sub-therapeutic.   Waiting for reassessment today, hopefully he will be able to be discharged home soon.    I personally performed the services described in this documentation, which was scribed in my presence. The recorded information has been reviewed and considered.   Devoria Albe, MD, [facility]   Ward Givens, MD 09/20/13 [phone]

## 2013-09-20 NOTE — ED Notes (Signed)
Pt.has slept soundly with eyes closed from [id number]

## 2013-09-21 LAB — VALPROIC ACID LEVEL: Valproic Acid Lvl: 30.6 ug/mL — ABNORMAL LOW (ref 50.0–100.0)

## 2013-09-21 NOTE — Progress Notes (Signed)
Adam Nichols, MHT spoke with Adam Nichols care coordinator with [facility] and learned that patient despite his disability is legally his own guardian, because of this Adam Nichols cannot move forward with coordination of care unless patient signs consents which would be inappropriate due to patients mental status. Writer contacted the mother who confirms that she does not have legal guardianship but has been attempting to do so but with no success. Writer advised mother to seek emergency guardianship in efforts to assist with care coordination from [facility]. Writer contacted Adam Nichols and provided update of proposed plan for mother to obtain emergency guardianship which mother has agreed to do on today (09/21/13). Adam Nichols states that she will make contact with mother over the weekend to obtain progress of efforts to obtain emergency guardianship and plan to meet face to face on 09/24/13.

## 2013-09-21 NOTE — Progress Notes (Signed)
Adam Nichols, MHT received report from [place], [place] attending that patients' Depakote level remains sub therapeutic at 30.6 as of 09/21/13.

## 2013-09-21 NOTE — ED Notes (Signed)
Pt ambulated to restroom & returned to room w/ no complications. 

## 2013-09-21 NOTE — ED Notes (Signed)
Patient was provided a meal tray. Pt. Consumed 90% of meal tray.

## 2013-09-21 NOTE — ED Notes (Signed)
Spoke with Victorino December with [facility] - requesting to have pt sign consent for release of information to [facility] for continuity of care.  States pt is his own guardian and is able to give consent and mother is not able to sign consent form.  Bruce Womple from [facility] contacted and explained situation.  Per Bruce, pt not mentally competent to sign consent form and will contact Karian at [facility].  Pt's mother contacted and agreed with plan.

## 2013-09-21 NOTE — ED Notes (Signed)
Pt resting quietly.  No distress noted.  

## 2013-09-21 NOTE — ED Notes (Signed)
Pt curled up on bed, NAD noted. Sitter remains w/ pt. No word on mother getting emergency custody of pt yet.

## 2013-09-21 NOTE — ED Notes (Signed)
Gave pt dinner tray.  Pt eating.  nad noted

## 2013-09-22 ENCOUNTER — Emergency Department (HOSPITAL_COMMUNITY): Payer: Medicare Other

## 2013-09-22 NOTE — ED Notes (Signed)
Pt awake eating breakfast 

## 2013-09-23 ENCOUNTER — Encounter (HOSPITAL_COMMUNITY): Payer: Self-pay | Admitting: *Deleted

## 2013-09-23 MED ORDER — DIVALPROEX SODIUM 125 MG PO CPSP
250.0000 mg | ORAL_CAPSULE | Freq: Two times a day (BID) | ORAL | Status: DC
  Administered 2013-09-23 – 2013-09-26 (×6): 250 mg via ORAL
  Filled 2013-09-23 (×6): qty 2
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
## 2013-09-23 NOTE — ED Notes (Signed)
Per Charlynn Court at [facility], patient is waiting for placement.  Has sent referrals to several facilities.  Dr. Adriana Simas is ware.

## 2013-09-23 NOTE — ED Notes (Signed)
Lunch given.

## 2013-09-23 NOTE — ED Provider Notes (Signed)
Per nursing, plan is for d/c home tomorrow (monday) once mother clears paperwork for guardianship Pt ambulatory, no distress at this time   Joya Gaskins, MD 09/23/13 1045

## 2013-09-23 NOTE — ED Notes (Signed)
Received report on pt, pt sitting up on side of bed, eating breakfast tray watching cartoons, pt cooperative, calm at present, sitter remains at bedside,

## 2013-09-23 NOTE — ED Notes (Signed)
Waiting for guardianship paper work for patient to be disposition

## 2013-09-23 NOTE — ED Notes (Signed)
Pt resting with eyes closed, resp even and non labored, sitter remains at bedside,  

## 2013-09-23 NOTE — Progress Notes (Signed)
D/t current recommendation for inpatient treatment after re-assessment on 11/16 pt's referral has been re-faxed to [facility].  Tomi Bamberger, MHT

## 2013-09-23 NOTE — Progress Notes (Signed)
The pt's referral has been faxed to the following facilities with updated assessment:  [facility]  [place], MHT

## 2013-09-23 NOTE — ED Notes (Signed)
Patient has been sitting in bed rocking back and forth and shaking hands intermittently over the past hour. PRN Benadryl administered for sleep.

## 2013-09-23 NOTE — ED Notes (Signed)
Spoke with [facility] via telespysch re-evaluation.  [facility] to follow up and update on his plan of care.

## 2013-09-23 NOTE — Consult Note (Signed)
Shift report stated pt is at subtherapeutic depakote levels with 250 mg x 1.Levels have varied between 21 and 34 with target 50.Will increase dose to BID starting am tomorrow 11/17 and recheck level 6am 11/18.

## 2013-09-24 ENCOUNTER — Encounter (HOSPITAL_COMMUNITY): Payer: Self-pay | Admitting: *Deleted

## 2013-09-24 MED ORDER — BENZTROPINE MESYLATE 1 MG PO TABS
ORAL_TABLET | ORAL | Status: AC
  Filled 2013-09-24: qty 1

## 2013-09-24 NOTE — Progress Notes (Signed)
B.Challen Spainhour, MHT made attempts to contact parent this am with no success. Writer spoke with Daralene Milch of [facility] who reports that mother cancelled there meeting this am as she is attempting to seek emergency guardianship on today. Mrs. Walker reports a tentative plan to meet with patient and mother today and obtain signature for consent to release if mother is still unable to obtain guardianship papers. Mrs. Walker hopes to obtain consent in efforts to link patient with services. Writer informed Tiffany, RN at [employer] of this. Tiffany, RN states that patients Depakote level is still low.

## 2013-09-24 NOTE — ED Notes (Signed)
Pt up to shower with CNA and security. nad noted.

## 2013-09-24 NOTE — ED Notes (Signed)
RN spoke with Jill Alexanders Pod[facility], states pt is not eligible for placement at most facilities due to being non-verbal. Involuntary paperwork faxed to [phone].

## 2013-09-24 NOTE — ED Notes (Signed)
Pt served Research scientist (medical) by [employer]. RN at bedside. nad noted.

## 2013-09-24 NOTE — Progress Notes (Signed)
[facility] declined due to no bed availability for several days.  Adam Nichols

## 2013-09-24 NOTE — ED Notes (Signed)
RN spoke with Adam Nichols-states pt mother is returning to courthouse this am the work on obtaining guardianship.

## 2013-09-24 NOTE — ED Notes (Signed)
Pt took morning medication whole, in applesauce with no problems. Pt cooperative and smiling. Pt currently eating snack. Nad noted.

## 2013-09-25 NOTE — Clinical Social Work Note (Signed)
Phone call from Daralene Milch, [facility], she is looking for emergency respite beds in area, has contacted [facility] and [place] to check availability.  Will meet w mother today to develop plan of care.  Santa Genera, LCSW Clinical Social Worker [phone])

## 2013-09-25 NOTE — ED Notes (Signed)
In bridge call this morning nurse was told all attempts to find placement for patient had been exhausted. Nurse was told that DSS would have to get involved since they had not been able to get in touch with the mom for the past few days. Nurse was able to call mother, Eber Jones, this morning. States she had been in court trying to get guardianship papers. States she brought them to the ED last night and dropped them off. Diannia Ruder, the Child psychotherapist for [facility] made aware of situation with patient and states they will look into it today. Nurse also called Wynetta Emery at [facility] # [phone] and left message.

## 2013-09-25 NOTE — BHH Counselor (Signed)
TC to EDP Cook to check if med recommendation was needed as [facility]TS and [facility] working on stabilizing pt to possible out of home placement. EDP Adriana Simas reports that pt has been calm and there is no need for a telepsych/med recommendation from TTS.   Evette Cristal, [place] Assessment Counselor

## 2013-09-25 NOTE — Clinical Social Work Note (Signed)
Mother given number for [employer] [place] parent counselor Delma Post [phone]) - for additional support and resources.  Mother states she will do so - has participated in parent support groups in past.  Santa Genera, LCSW Clinical Social Worker [phone])

## 2013-09-25 NOTE — ED Notes (Signed)
Received report from off going RN.  States patient has been given a second meal tray.   Ate 100 % of second meal tray.  Sitting on side of bed with sitter at room observing.  Patient calm at present, did not communicate verbally when RN introduced self.

## 2013-09-25 NOTE — BHH Counselor (Addendum)
TC from [employer] at [facility] re: pt placement. Thurston Hole has spoken with rep from [facility] and with pt's mother this am. Writer reviewed IVC paperwork for pt. The IVC expired 09/24/13 as it was created 11/10 for 7 days. Writer spoke w/ pt's RN Neysa Bonito who will redo the IVC and will fax copy to TTS later today. According to paperwork faxed from Thurston Hole, pt's mother Raef Sprigg has been appointed interim guardian from 11/17 to 10/30/13. The petition for adjudication of incompetence was signed and notarized 09/24/13. Writer left voicemail for TTS MHT Bruce re: disposition.   Evette Cristal, [place] Assessment Counselor

## 2013-09-25 NOTE — Clinical Social Work Note (Signed)
CSW spoke w mother, Adam Nichols [phone]).  Says son has been diagnosed w autism, beginning at age 22 at [facility] in [place].  She has tried to get these initial records but was told they were not available.  Mother has copies of school IEP as well as summary of 10 - 12th grade from teachers, says diagnosis on IEP is autism and has record of IQ at 88.  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 When patient was resident of [facility] was enrolled at [facility].  Says patient sees Jorje Guild, MD at [facility] for medications management - last visit was Oct 23 or 29.  Mother is also caring for 25 year old brother of patient at home who also has autism.    Per B Ratliffe, financial counselor, patient has active Medicaid.  Information will be added to chart by registration.    CSW encouraged mother to contact care manager at [facility] to initiate services - given numbers for Daralene Milch [phone]) and Lisabeth Pick [phone]).   CSW faxed copies of emergency guardianship and IVC paperwork to [facility] counselor Page for review.  Per Page,  IVC expired yesterday, Page to contact RN to have reassessment of IVC initiated.  Santa Genera, LCSW Clinical Social Worker [phone])

## 2013-09-25 NOTE — Progress Notes (Addendum)
[facility]: @ 18 Spoke with Adam Nichols she stated pt has been denied several times, they no longer accept MR pts. It has been converted to an adult unit. [facility]: @1228  Paged, call returned and pt had been declined due to no beds available. Currently have one bed available but with a potential Pt. Pt information would be held to consider placement.    Alera Quevedo Cathey, MHT

## 2013-09-25 NOTE — Clinical Social Work Note (Signed)
CSW faxed copy of emergency guardianship order to Daralene Milch [facility] care coordinator) w mother's consent.  Spoke w Dan Humphreys, says she will contact mother to meet w her today to develop plan of care.  Dan Humphreys states many resources for residential placement for people w autism have waiting lists, is going to see if patient was placed on any wait lists while receiving services in [facility] approx 10 years ago.  Also is trying to substantiate diagnosis of autism by retrieving records from school and LME in order to take patient onto their patient roll for services. States she received referral on patient on 09/21/13, could not develop plan of care at that time because patient was his own guardian.  Now that mother is emergency guardian, mother can participate in plan development.  Walker plans to offer respite care if patient's medications can be adjusted such that he can return home, will also pursue group home placement but thinks most that serve people w autism are currently at capacity.  Was under impression that [facility] was pursuing placement at [facility], states she did not know that out of home/residential placement was desired until today.  CSW also spoke w patient's former pediatrician, Dr Arita Miss [phone]) who expressed great concern about patient and strongly recommends out of home placement, says he has known patient since childhood and verifies diagnosis of autism.  Records are no longer available from his office as he has been retired for a number of years.  Santa Genera, LCSW Clinical Social Worker [phone]) '

## 2013-09-25 NOTE — ED Notes (Signed)
Dr. Arita Miss, retired pediatrician for Adam Nichols called and stated the mother of Mr Wich had called him to see if he could help with placement. Dr. Arita Miss was concerned for pt's welfare since he had been in the ED so long and was not receiving the care he needed. Santa Genera our social worker made aware of concern

## 2013-09-25 NOTE — ED Notes (Signed)
Santa Genera, hospital social worker involved in patient's care

## 2013-09-26 MED ORDER — TRAZODONE HCL 100 MG PO TABS: 100.0000 mg | ORAL_TABLET | Freq: Every day | ORAL | Status: DC

## 2013-09-26 MED ORDER — BENZTROPINE MESYLATE 1 MG PO TABS: 1.0000 mg | ORAL_TABLET | Freq: Every day | ORAL | Status: DC

## 2013-09-26 MED ORDER — CLONAZEPAM 0.5 MG PO TABS: 0.5000 mg | ORAL_TABLET | Freq: Three times a day (TID) | ORAL | Status: DC

## 2013-09-26 MED ORDER — DIVALPROEX SODIUM 125 MG PO CPSP: 250.0000 mg | ORAL_CAPSULE | Freq: Every evening | ORAL | Status: DC

## 2013-09-26 MED ORDER — BENZTROPINE MESYLATE 1 MG PO TABS
ORAL_TABLET | ORAL | Status: AC
  Administered 2013-09-26: 1 mg via ORAL
  Filled 2013-09-26: qty 1

## 2013-09-26 NOTE — ED Notes (Signed)
Behavioral health called, mother should be here within the hour.

## 2013-09-26 NOTE — BH Assessment (Cosign Needed)
Patient evaluated by Psychiatrist/Medical Director-Dr. Lucianne Muss via telepsych assessment. Writer present during this interaction. Patient initially presented to the ED irritated with aggressive behaviors toward his mother. Per his mother this aggressive behavior was a change from patient's baseline. Patient's mother was requesting assistance with this situation. She was also referred by her [place] psychiatrist Diannia Ruder at the [facility] outpatient office. Patient's psychiatrist observed this behavior and recommended inpatient treatment.   Patient has remained in the ED at [facility] since 09/17/2013. Patient's behaviors have improved significantly over the past several days. Dr. Lucianne Muss and this writer have observed patient's behavior today. Patient is non verbal but we were able to see observe that patient is not at all aggressive. He is alert and responds to command. Dr. Lucianne Muss recommends discharge home and appropriate follow up to [facility], [facility] care coordinator Shonna Chock [phone] for respite care, mobile crises [phone] when needed, and his current psychiatrist Diannia Ruder, MD, etc.   Writer shared the above information with EDP-Dr. Adriana Simas and patient's nurse-Tiffany.   Most importantly, patient's mother agrees with the above plan and agrees to take patient home. She has has a upcoming appointment October 10, 2013. Patient's mother however has a court date on that day and would like another appointment. This Clinical research associate assisted with rescheduling patient's appointment for October 01, 2013 @ 1445.  Writer has called patient's mother to inform her that patient is ready for discharge. Patient's mother sts she is on her way to pick patient up from [facility] ED.

## 2013-09-26 NOTE — BHH Counselor (Deleted)
Dr Kumar

## 2013-09-26 NOTE — Clinical Social Work Note (Signed)
Call from Leodis Sias, [place] DSS Adult Services [phone]).  Says she has been working w patient approx 2 weeks to get services in place.  CSW contacting Adult Protective Services to determine whether patient has active APS case, Diona Browner was unclear about why she received referral for services for patient.  DSS was in process of investigating obtaining guardianship for patient, but had not filed paperwork yet.  Santa Genera, LCSW Clinical Social Worker [phone])

## 2013-09-26 NOTE — ED Notes (Signed)
Patient given discharge instruction, verbalized understand. Patient ambulatory out of the department with mother  

## 2013-09-26 NOTE — ED Provider Notes (Signed)
Discussed medication recommendations with behavioral health.   Patient advised to take the following:   Depakote 125 mg 2 tablets at bedtime.    Klonopin 0.5 mg 3 times a day.    Cogentin 1 mg daily.   Trazodone 100 mg one tablet at bedtime.    Prescriptions for one month with one month's refill given.   Patient has follow up with psychiatrist tomorrow  Donnetta Hutching, MD 09/26/13 [phone]
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
## 2013-09-26 NOTE — ED Notes (Signed)
Pt accepted meds and took with no difficulty. Pt calm and cooperative at this time. Pt is nonverbal but smiled when eating the applesauce.

## 2013-09-26 NOTE — ED Notes (Signed)
Pt resting in bed, per report pt has been well behaved on previous shift. Pt's medications are to be crushed and mixed with applesauce or ice cream. Pt's IVC paperwork is up to date and correctly filled out.

## 2013-09-26 NOTE — ED Provider Notes (Signed)
No homicidal or suicidal ideation.  Tele-psychiatry consultation recommends discharge. Patient has appointment on November 24 at 2:45 PM at [facility] behavioral health outpatient in [place], [facility]  Donnetta Hutching, MD 09/26/13 1500
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
## 2013-09-26 NOTE — ED Notes (Signed)
Patient took medication mixed with vanilla ice cream.  Ate the rest of the cup of ice cream after taking meds.  Sitter at bedside

## 2013-09-26 NOTE — Clinical Social Work Note (Signed)
Call from Shonna Chock, [facility] care coordinator.  States [facility] liaison told her patient had been accepted at [facility] and was currently 3rd on waiting list.  Verified w [place], [facility] assessment, that patient is not on [facility] wait list - left VM informing care coordinator.  Dan Humphreys states she had appt to meet w patient's mother/emergency guardian to develop plan of care 11/18.  Santa Genera, LCSW Clinical Social Worker [phone])

## 2013-09-28 ENCOUNTER — Telehealth (HOSPITAL_COMMUNITY): Payer: Self-pay

## 2013-10-01 ENCOUNTER — Encounter (HOSPITAL_COMMUNITY): Payer: Self-pay | Admitting: Psychiatry

## 2013-10-01 ENCOUNTER — Ambulatory Visit (INDEPENDENT_AMBULATORY_CARE_PROVIDER_SITE_OTHER): Payer: Medicare Other | Admitting: Psychiatry

## 2013-10-01 VITALS — Wt 179.0 lb

## 2013-10-01 DIAGNOSIS — F909 Attention-deficit hyperactivity disorder, unspecified type: Secondary | ICD-10-CM

## 2013-10-01 DIAGNOSIS — F840 Autistic disorder: Secondary | ICD-10-CM

## 2013-10-01 MED ORDER — CLONAZEPAM 0.5 MG PO TABS: 0.5000 mg | ORAL_TABLET | Freq: Every day | ORAL | Status: DC

## 2013-10-01 MED ORDER — ARIPIPRAZOLE 10 MG PO TABS: 10.0000 mg | ORAL_TABLET | Freq: Every day | ORAL | Status: DC

## 2013-10-01 MED ORDER — DIVALPROEX SODIUM 125 MG PO CPSP: ORAL_CAPSULE | ORAL | Status: DC

## 2013-10-01 NOTE — Progress Notes (Signed)
Patient ID: DEMETRIUS MAHLER, male   DOB: 1991-03-09, 22 y.o.   MRN: [id number] Patient ID: JEDAIAH RATHBUN, male   DOB: [birth year], 22 y.o.   MRN: [id number]   [facility] Follow-up Outpatient Visit  Ritvik Mczeal Magley Mar 26, 1991  Date:  10/01/13   Subjective: This patient is a 22 year old black male who lives with his mother and 91 year old brother in [place]. He attends [employer] and a developmental disability program. 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  Last saw the patient on November 10 he was very agitated and out of control. He was sent to the [facility] emergency room. He was supposed to have been admitted somewhere for evaluation and treatment but nowhere would take him because of his severe autism. He was treated in the ER for 9 days and released on the 19th. He is now at clonazepam Depakote trazodone and Cogentin. The clonazepam at 0.5 mg dosage only holds him for short time like an hour or 2. He's not sleeping most nights and getting agitated. He's constantly in motion and will not sit still. He told the mom is probably time to retry an antipsychotic. His Depakote level was only 53 and this can go up as well as the clonazepam  Weight 180 pounds, height 5 foot 6 inches  Mental Status Examination  Appearance: Casual Alert: Yes Attention non-existent Cooperative: No he cannot stop moving Eye Contact: Absent Speech: Absent Psychomotor Activity: Normal Memory/Concentration: Undetermined Oriented: Undetermined Mood: Dysphoric Affect: Congruent Thought Processes and Associations: Undetermined Fund of Knowledge: Poor Thought Content: Undetermined Insight: Poor Judgement: Non-existent  Diagnosis: Autism, ADHD combined type  Treatment Plan:  For now, we'll continue trazodone 100 mg each bedtime and Cogentin 1 mg every morning. He'll increase clonazepam 0.5 mg to 5 times a day, increase Depakote  sprinkles to 750 mg each bedtime and add Abilify 10 mg each bedtime. He'll return in 4 weeks but if he becomes more agitated his mother will calm he  Diannia Ruder, MD

## 2013-10-02 ENCOUNTER — Encounter (HOSPITAL_COMMUNITY): Payer: Self-pay | Admitting: Emergency Medicine

## 2013-10-02 ENCOUNTER — Emergency Department (HOSPITAL_COMMUNITY)
Admission: EM | Admit: 2013-10-02 | Discharge: 2013-10-09 | Disposition: A | Discharge status: Home / Self Care | Payer: Medicare Other | Attending: Emergency Medicine | Admitting: Emergency Medicine

## 2013-10-02 DIAGNOSIS — F840 Autistic disorder: Secondary | ICD-10-CM

## 2013-10-02 DIAGNOSIS — Z79899 Other long term (current) drug therapy: Secondary | ICD-10-CM | POA: Insufficient documentation

## 2013-10-02 LAB — BASIC METABOLIC PANEL
BUN: 13 mg/dL (ref 6–23)
CO2: 27 mEq/L (ref 19–32)
Chloride: 105 mEq/L (ref 96–112)
Creatinine, Ser: 1.03 mg/dL (ref 0.50–1.35)
GFR calc Af Amer: >90 mL/min (ref >90)
GFR calc non Af Amer: >90 mL/min (ref >90)
Potassium: 3.6 mEq/L (ref 3.5–5.1)

## 2013-10-02 LAB — CBC WITH DIFFERENTIAL/PLATELET
Basophils Absolute: 0 10*3/uL (ref 0.0–0.1)
Basophils Relative: 0 % (ref 0–1)
Eosinophils Relative: 3 % (ref 0–5)
HCT: 41 % (ref 39.0–52.0)
Hemoglobin: 14.1 g/dL (ref 13.0–17.0)
Lymphocytes Relative: 44 % (ref 12–46)
Lymphs Abs: 3.1 10*3/uL (ref 0.7–4.0)
MCHC: 34.4 g/dL (ref 30.0–36.0)
Monocytes Absolute: 0.7 10*3/uL (ref 0.1–1.0)
Neutro Abs: 3 10*3/uL (ref 1.7–7.7)
Neutrophils Relative %: 43 % (ref 43–77)
RBC: 4.6 MIL/uL (ref 4.22–5.81)
RDW: 12.9 % (ref 11.5–15.5)
WBC: 7 10*3/uL (ref 4.0–10.5)
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
## 2013-10-02 NOTE — ED Notes (Addendum)
Pt has been changed into paper scrubs, clothes bagged and in locked cabinet within dept., security called and to wand pt, [employer] at bedside, stating that pt is much calmer at present than earlier

## 2013-10-02 NOTE — BHH Counselor (Signed)
Writer spoke with Tina-AC regarding the activation of CSW to take over and the possible involvement of DSS to find placement for the pt. Inetta Fermo called the Charge Nurse and confirmed that Social Work will be contacted on 10/03/13. Pt is inaudible and TTS/TTP are not able to assess the pt. Writer spoke with Dr. Adriana Simas and discussed the pt's current disposition. Denice Bors, [facility] 10/02/2013 11:08 PM

## 2013-10-03 ENCOUNTER — Telehealth (HOSPITAL_COMMUNITY): Payer: Self-pay

## 2013-10-03 MED ORDER — TRAZODONE HCL 50 MG PO TABS
100.0000 mg | ORAL_TABLET | Freq: Every day | ORAL | Status: DC
  Administered 2013-10-03 – 2013-10-08 (×6): 100 mg via ORAL
  Filled 2013-10-03 (×6): qty 2

## 2013-10-03 MED ORDER — ARIPIPRAZOLE 10 MG PO TABS
10.0000 mg | ORAL_TABLET | Freq: Every day | ORAL | Status: DC
  Administered 2013-10-03 – 2013-10-08 (×6): 10 mg via ORAL
  Filled 2013-10-03 (×3): qty 1
  Filled 2013-10-03: qty 2
  Filled 2013-10-03 (×3): qty 1
  Filled 2013-10-03: qty 2

## 2013-10-03 MED ORDER — DEXMETHYLPHENIDATE HCL ER 5 MG PO CP24
10.0000 mg | ORAL_CAPSULE | Freq: Every day | ORAL | Status: DC
Start: 2013-10-03 — End: 2013-10-03

## 2013-10-03 MED ORDER — DIPHENHYDRAMINE HCL 12.5 MG/5ML PO ELIX
25.0000 mg | ORAL_SOLUTION | Freq: Every day | ORAL | Status: DC | PRN
  Administered 2013-10-03 – 2013-10-05 (×2): 25 mg via ORAL
  Filled 2013-10-03 (×4): qty 10

## 2013-10-03 MED ORDER — BENZTROPINE MESYLATE 1 MG PO TABS
ORAL_TABLET | ORAL | Status: AC
  Filled 2013-10-03: qty 1

## 2013-10-03 MED ORDER — CLONAZEPAM 0.5 MG PO TABS
0.5000 mg | ORAL_TABLET | Freq: Every day | ORAL | Status: DC
  Administered 2013-10-03 – 2013-10-09 (×31): 0.5 mg via ORAL
  Filled 2013-10-03 (×32): qty 1

## 2013-10-03 MED ORDER — DIVALPROEX SODIUM 125 MG PO CPSP
375.0000 mg | ORAL_CAPSULE | Freq: Every day | ORAL | Status: DC
  Administered 2013-10-03 – 2013-10-08 (×6): 375 mg via ORAL
  Filled 2013-10-03 (×7): qty 3

## 2013-10-03 MED ORDER — OLANZAPINE 10 MG PO TBDP: 15.0000 mg | ORAL_TABLET | Freq: Every day | ORAL | Status: DC

## 2013-10-03 MED ORDER — BENZTROPINE MESYLATE 1 MG PO TABS
1.0000 mg | ORAL_TABLET | Freq: Every day | ORAL | Status: DC
  Administered 2013-10-03 – 2013-10-09 (×7): 1 mg via ORAL
  Filled 2013-10-03 (×2): qty 1

## 2013-10-03 MED ORDER — ZIPRASIDONE MESYLATE 20 MG IM SOLR
INTRAMUSCULAR | Status: AC
  Administered 2013-10-03
  Filled 2013-10-03: qty 20

## 2013-10-03 MED ORDER — STERILE WATER FOR INJECTION IJ SOLN
INTRAMUSCULAR | Status: AC
  Administered 2013-10-03
  Filled 2013-10-03: qty 10

## 2013-10-03 NOTE — ED Notes (Signed)
APS called back and informed me to call [facility] back and explain to them that this pt need "emergency respite placement". [facility] stated that they had called [facility] and [facility] (?). I stated that APS had informed me of multiple other places that they could attempt to call for emergency respite care. [facility] was unaware of these other places and APS is to call them in order to provide information for possible placement for the pt.

## 2013-10-03 NOTE — ED Notes (Signed)
While taking pt. Vitals he was moving does not stay still long for vitals.

## 2013-10-03 NOTE — ED Notes (Signed)
Per Dr. Blinda Leatherwood, Dr. Arita Miss, (pts recently retired pediatrician) has advised pts mother to refuse to pick pt up. Called [facility] and left a message. Pt has been very calm and cooperative today, mostly resting.

## 2013-10-03 NOTE — Telephone Encounter (Signed)
Pt is still in the ED at present. I told mom to keep him on Abilify only, not to combine 2 antipsychotics

## 2013-10-03 NOTE — ED Notes (Signed)
[facility] called back and informed me that pts mother is refusing to pick pt up.

## 2013-10-03 NOTE — ED Provider Notes (Signed)
Pt may be able to get periodic respite at [facility] this weekend, and then temp respite next week for 5 days while permanent placement is being arranged Pt is stable, no distress, nonverbal Lung sounds clear, no abdominal tenderness, stable   Joya Gaskins, MD 10/03/13 1525

## 2013-10-03 NOTE — ED Notes (Signed)
Phoned Dewayne Hatch, Child psychotherapist (AP). She stated there was nothing she could do at this point, that we needed to call Hardie Lora from [facility]. [phone]. Phoned Ms. Walker and she stated she would contact [facility] to see about possible placement. Explained to her that even if [facility] could not accept pt, then mother would need to be contacted to take pt back home.
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
## 2013-10-03 NOTE — Clinical Social Work Note (Signed)
CSW spoke w ED RN regarding request for CSW to facilitate placement for patient via DSS.  CSW has spoken w Daralene Milch, [facility] care coordinator for patient.  Is aware that patient is in ED, has worked w mother/guardian to facilitate respite placement at [facility] and is waiting call from [facility] to verify that bed is available.  CSW spoke w supervisor, explained patient situation.  SW cannot facilitate placement for patient as any placement must be facilitated through [facility] care coordinator.  Supervisor suggested that RN address need for patient to continue on home medications while in ED as well as need to let mother know that she will need to work w [facility] to get patient placed from home.  Mardene Celeste RN in ED informed.  Santa Genera, LCSW Clinical Social Worker [phone])

## 2013-10-03 NOTE — ED Provider Notes (Signed)
Pt now awaiting group home placement He is resting comfortably eating a meal BP 117/61  Pulse 81  Temp(Src) 98.5 F (36.9 C) (Oral)  Resp 20  SpO2 98%   Joya Gaskins, MD 10/03/13 1309

## 2013-10-03 NOTE — Clinical Social Work Note (Signed)
CSW spoke with Maximiano Coss at DSS and Merrily Brittle with [facility]. DSS is speaking with pt's mother now to discuss plan and encourage her to pick up pt from ED. [facility] has arranged for respite care to begin at [facility] on Monday for 5 days. They can also provide periodic respite for about 5 hours each day Saturday and Sunday. CSW completed FL2 for DSS and [facility] to use to find more permanent placement and faxed to both. Per Maximiano Coss and Merrily Brittle, [facility]arr is not needed for DDA level of care. CSW will follow up on Friday if pt remains in ED to determine weekend plan. RN and EDP updated.    Derenda Fennel, [place] [phone]

## 2013-10-03 NOTE — ED Notes (Signed)
Spoke with [place] with social work - states is working on [facility] for pt placement but unless mother takes pt home, pt will be here "for a while."

## 2013-10-03 NOTE — ED Notes (Signed)
Called pharmacy for [facility] [employer]
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
## 2013-10-03 NOTE — ED Notes (Signed)
Per Dr. Bebe Shaggy - DSS-APS is working on possible respite emergency care at [facility] for the weekend.  Will f/u when plans are final.
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
## 2013-10-04 MED ORDER — BENZTROPINE MESYLATE 1 MG PO TABS
ORAL_TABLET | ORAL | Status: AC
  Filled 2013-10-04: qty 1

## 2013-10-04 MED ORDER — ARIPIPRAZOLE 10 MG PO TABS
ORAL_TABLET | ORAL | Status: AC
  Filled 2013-10-04: qty 1

## 2013-10-04 NOTE — ED Notes (Signed)
Pt cooperative at present time.

## 2013-10-04 NOTE — ED Notes (Addendum)
Pt up to shower. Pt has been quiet and cooperative all morning
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
## 2013-10-04 NOTE — ED Notes (Signed)
Patient becoming restless, edp notified, advised to give dose of abilify, AC notified of need for med

## 2013-10-05 MED ORDER — BENZTROPINE MESYLATE 1 MG PO TABS
ORAL_TABLET | ORAL | Status: AC
  Filled 2013-10-05: qty 1

## 2013-10-05 NOTE — ED Notes (Signed)
Pt occasional makes screeching noises

## 2013-10-05 NOTE — ED Notes (Signed)
Patient resting with blanket over his head. Vitals in normal limit. Patient is cooperative at this time.

## 2013-10-05 NOTE — ED Notes (Signed)
Pt got up to run out, pt easily directed back to room, two incidents, pt given food, pt seems to be calmer now.
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
## 2013-10-05 NOTE — Clinical Social Work Note (Signed)
Darrall Dears, [facility], confirmed that facility plans to pick up patient Monday approx noon to provide respite care beginning 10/08/13.  Will pick up after receiving authorization from [facility].  Santa Genera, LCSW Clinical Social Worker [phone])

## 2013-10-07 MED ORDER — BENZTROPINE MESYLATE 1 MG PO TABS
ORAL_TABLET | ORAL | Status: AC
  Filled 2013-10-07: qty 1
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
## 2013-10-07 NOTE — ED Notes (Signed)
Pt tolerated meal tray well, fed self. Sitter remains at bedside.

## 2013-10-07 NOTE — ED Notes (Signed)
Pt awaiting pm meal tray, calm at present, sitter at bedside.

## 2013-10-07 NOTE — ED Notes (Signed)
Pt asleep in bed. Sitter at bedside  °

## 2013-10-07 NOTE — ED Notes (Signed)
Pt sitting up in bed, meal re-heat and given to pt. He fed himself.  Walked un-aided to the bathroom. Sitter remains at bedside.

## 2013-10-07 NOTE — ED Notes (Signed)
Pt remains asleep in bed, normal rise and fall of chest noted. Sitter at bedside. Lunch meal tray at bedside.

## 2013-10-08 MED ORDER — STERILE WATER FOR INJECTION IJ SOLN
INTRAMUSCULAR | Status: AC
  Administered 2013-10-08: 23:00:00
  Filled 2013-10-08: qty 10

## 2013-10-08 MED ORDER — ZIPRASIDONE MESYLATE 20 MG IM SOLR
20.0000 mg | Freq: Once | INTRAMUSCULAR | Status: AC
  Administered 2013-10-08: 20 mg via INTRAMUSCULAR
  Filled 2013-10-08: qty 20

## 2013-10-08 MED ORDER — BENZTROPINE MESYLATE 1 MG PO TABS
ORAL_TABLET | ORAL | Status: AC
  Filled 2013-10-08: qty 1

## 2013-10-08 NOTE — ED Notes (Addendum)
Pt has urinated in bed, pt woke & carried to restroom & assisted w/ cleaning. Sheets changed & returned to room.

## 2013-10-08 NOTE — ED Notes (Signed)
Pt sitting indian style in the bed. NAD.

## 2013-10-08 NOTE — ED Notes (Signed)
Spoke with Adam Nichols, Care Coordinator from [facility] -calling to obtain further info on pt behavior and stability.  States provider who may possibly accept pt is still considering acceptance. states is working on placement for pt. Will call back with further info when available.

## 2013-10-08 NOTE — Clinical Social Work Note (Signed)
CSW spoke w Charisse Klinefelter, [facility] care coordinator supervisor.  Confirms authorization was submitted on Weds, is currently in [facility].  Tennis Ship will contact [facility] and ask them to expedite processing of request.  Santa Genera, LCSW Clinical Social Worker [phone])

## 2013-10-08 NOTE — Clinical Social Work Note (Signed)
CSW spoke w Mahala Menghini of [place] DSS.  Per Zane Herald of DSS has called mother to determine if she is able/willing to transport patient to planned respite care at [facility] today.  If mother says she cannot transport, DSS will transport if needed.  CSW has left VM for Daralene Milch, patient's care coordinator at [facility], to get update on plan for patient discharge.  Per Jacqulyn Ducking, DSS is also calling [facility].  Santa Genera, LCSW Clinical Social Worker [phone])

## 2013-10-08 NOTE — ED Provider Notes (Signed)
Pt awaiting placement  Adam Lennert, MD 10/08/13 1432

## 2013-10-08 NOTE — Clinical Social Work Note (Signed)
Patient has new [facility] care coordinator [facility] Sh[place] [phone]).  CSW has left voicemail attempting to determine status of authorization for respite care at [facility].    Santa Genera, LCSW Clinical Social Worker [phone])
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
## 2013-10-08 NOTE — Clinical Social Work Note (Signed)
Spoke with Shelly from [facility] this afternoon who reports they are still working on respite placement. Updated Felissa at DSS.    Derenda Fennel, [place] [phone]

## 2013-10-08 NOTE — ED Notes (Signed)
Had to wake pt up to take his meds. Pt took his meds and went right back to sleep.

## 2013-10-08 NOTE — ED Notes (Signed)
DSS called to see if transportation has been coordinated for pt today. DSS to call [facility] coordinator for pt to determine status.

## 2013-10-08 NOTE — ED Notes (Signed)
Pt becoming loud & squealing. Went to room to calm pt. Spoke to EDP about medications.

## 2013-10-09 NOTE — Clinical Social Work Note (Signed)
CSW spoke w [facility] staff, Halina Maidens.  Says [facility] has released authorization for patient to be placed at [facility], care coordinator Minette Brine [phone]) will be contacting [facility] and arranging patient transport.  Suzie Portela says [facility] is looking for funding and placement for patient to be placed in longer term housing, will renew respite care for additional 5 days if nothing is in place at conclusion of initial 5 day stay because "this is a family in crisis."  [facility] informed of current status of plan.  Santa Genera, LCSW Clinical Social Worker [phone])

## 2013-10-09 NOTE — Clinical Social Work Note (Signed)
Received call from [facility]. Pt awaiting assessment by [facility] this afternoon. They are prepared to take pt from ED if they feel pt can be safely managed per [facility]. Called [facility] to try to get estimated time of arrival and staff were going to try to reach [place] who would be doing assessment. No return call yet. CSW notified [facility] by voicemail.  Derenda Fennel, [place] [phone]

## 2013-10-09 NOTE — ED Provider Notes (Signed)
8:01 AM  Pt here with autism after being found towards mother. Mother does not feel comfortable taking the patient home. Social work and DSS are working on placement for the patient. He is stable, no apparent distress.  Filed Vitals:   10/09/13 0626  BP: 114/57  Pulse: 62  Temp:   Resp: 20     Everson Mott N Makailah Slavick, DO 10/09/13 1610

## 2013-10-09 NOTE — ED Notes (Signed)
Pt resting w/ blanket pulled over his head, rise & fall of chest noted. Sitter remains w/ pt.

## 2013-10-09 NOTE — ED Notes (Signed)
Received report on pt, pt resting with eyes closed, will arouse when spoken to, sitter remains at bedside, pt cooperative at present,

## 2013-10-09 NOTE — ED Notes (Signed)
Pt alert, cooperative at present, still continues to eat breakfast tray, sitter remains at bedside,

## 2013-10-10 ENCOUNTER — Ambulatory Visit (HOSPITAL_COMMUNITY): Payer: Self-pay | Admitting: Psychiatry

## 2013-10-15 ENCOUNTER — Ambulatory Visit (HOSPITAL_COMMUNITY): Payer: Self-pay | Admitting: Psychiatry

## 2013-10-29 ENCOUNTER — Ambulatory Visit (HOSPITAL_COMMUNITY): Payer: Self-pay | Admitting: Psychiatry

## 2014-02-11 ENCOUNTER — Ambulatory Visit (HOSPITAL_COMMUNITY): Payer: Self-pay

## 2014-02-11 ENCOUNTER — Ambulatory Visit (HOSPITAL_COMMUNITY): Payer: Self-pay | Admitting: Speech Pathology

## 2014-02-12 ENCOUNTER — Ambulatory Visit (HOSPITAL_COMMUNITY): Admission: RE | Admit: 2014-02-12 | Payer: Medicare Other | Source: Ambulatory Visit | Admitting: Speech Pathology

## 2014-02-12 ENCOUNTER — Ambulatory Visit (HOSPITAL_COMMUNITY)
Admission: RE | Admit: 2014-02-12 | Discharge: 2014-02-12 | Disposition: A | Discharge status: Home / Self Care | Payer: Medicare Other | Source: Ambulatory Visit | Attending: Internal Medicine | Admitting: Internal Medicine

## 2014-02-12 DIAGNOSIS — R279 Unspecified lack of coordination: Secondary | ICD-10-CM | POA: Insufficient documentation

## 2014-02-12 DIAGNOSIS — IMO0001 Reserved for inherently not codable concepts without codable children: Secondary | ICD-10-CM | POA: Insufficient documentation

## 2014-02-12 DIAGNOSIS — R29818 Other symptoms and signs involving the nervous system: Secondary | ICD-10-CM | POA: Insufficient documentation

## 2014-02-12 DIAGNOSIS — R29898 Other symptoms and signs involving the musculoskeletal system: Secondary | ICD-10-CM | POA: Insufficient documentation

## 2014-02-12 DIAGNOSIS — M6281 Muscle weakness (generalized): Secondary | ICD-10-CM | POA: Insufficient documentation

## 2014-02-12 DIAGNOSIS — F840 Autistic disorder: Secondary | ICD-10-CM | POA: Insufficient documentation

## 2014-02-12 NOTE — Evaluation (Signed)
Occupational Therapy Evaluation  Patient Details  Name: Adam Nichols MRN: [id number] Date of Birth: [birth year]  Today's Date: 02/12/2014 Time: 1093-2355 OT Time Calculation (min): 25 min Eval 25'  Visit#: 1 of 6  Re-eval: 03/12/14  Assessment Diagnosis: Autism - Decreased fine motor Surgical Date:  (n/a)  Authorization: Medicare and Medicaid  Authorization Time Period: n/a  Authorization Visit#:   of     Past Medical History:  Past Medical History  Diagnosis Date  . Autism    Past Surgical History: No past surgical history on file.  Subjective Symptoms/Limitations Symptoms: "Food!" Pertinent History: Pt is 23 year old male with dx of autism who is presenting for OT evalaution from [place], with two individuals who work with him at the group home.  Pt began residing at the group home in [place] after previously living with his mother.  Pt was apparenlty becoming more combative and causing personal and property damange when living with his mom, who determined it would be best for him to live elsewhere.  He is currenlty presenting for OT services due to increased dropping of objects, specifically when carrying out the trash or eating.  he will drop food onto the floor, then reach down to pick it up to eat it.  Also importante to note is that pt prefers things to be closed (doors, cabinets, boxes), that he has significant rocking behaviors, and that he has minimal verbal skills. Patient Stated Goals: Improved grasp Pain Assessment Currently in Pain?: No/denies  Precautions/Restrictions  Precautions Precautions: None Restrictions Weight Bearing Restrictions: No  Balance Screening Balance Screen Has the patient fallen in the past 6 months: No  Prior [place] expects to be discharged to:: Group home Additional Comments: [facility] since November Prior Function Level of Independence: Needs assistance with ADLs;Needs  assistance with homemaking  Assessment ADL/Vision/Perception ADL ADL Comments: Per caregiver, pt requires assist for all ADLs.  he is able to dress himself, just requires cueing for each step.  pt is able to fingerfeed himself - caregiver is currenlty working towards using utensils. pt is currenlty knocking all food off plate and using pincer grasp to pick food up again (unless he is un a hurry and uses full palmar grasp).  he is currenlty prone to dropping objects. Dominant Hand: Right  Cognition/Observation Cognition Overall Cognitive Status: History of cognitive impairments - at baseline Observation/Other Assessments Other Assessments: Caregivers are currenlty working towards using utensils for eating. pt's non prefereed foods at beans, peas, bread/rolls/buns, oatmeal, and rice. Pt is comfortable with pretzels, chips, candies, and other small finger foods.  Sensation/Coordination/Edema Coordination Gross Motor Movements are Fluid and Coordinated: Yes Fine Motor Movements are Fluid and Coordinated: Yes 9 Hole Peg Test: right hand putting into box - 18.71 seconds. Bilateral hands putting into box - 12.2 seconds  Additional Assessments RUE Strength RUE Overall Strength Comments: Pt has tendency to flex/extend right wrist often - has small area of skin rough-ness on ulnar side of right wrist Grip (lbs): 41 (only 1 test) 3 Point Pinch: 5 lbs LUE Strength Grip (lbs): 55,39,35 (avg 43) 3 Point Pinch: 6 lbs Right Hand Strength - Pinch (lbs) 3 Point Pinch: 5 lbs Left Hand Strength - Pinch (lbs) 3 Point Pinch: 6 lbs   Occupational Therapy Assessment and Plan OT Assessment and Plan Clinical Impression Statement: Pt is presenting to OT services for eval and treatment due to decreased grip and pinch strength, decreased fine motor coordination, and decreased  ADL/increased drops. Pt will benefit from skilled therapeutic intervention in order to improve on the following deficits: Impaired UE  functional use;Decreased safety awareness;Decreased strength;Decreased coordination Rehab Potential: Fair OT Frequency: Min 1X/week OT Duration: 6 weeks OT Treatment/Interventions: Self-care/ADL training;Therapeutic exercise;Manual therapy;Therapeutic activities;Patient/family education OT Plan: Pt will benefit from OT services to improve hand strength and coordination and facilitate improved use of hands in ADL tasks (eating).    Teratment Plan: conduct sessions in private eval room - theraputty, hand gripper, PROM as able, weights as able, resisted clothespins, focus on transitiong eating to using utensils.   Goals Short Term Goals Time to Complete Short Term Goals: 3 weeks Short Term Goal 1: Pt and caregiver will be educated on HEP Short Term Goal 2: Pt will improve bilateral grip strength by atleast 5 lbs Short Term Goal 3: Pt will improve pinch strength by atleast 3 lbs  Short Term Goal 4: Caregiver will verbalize 50% decreased drops during functional activities. Long Term Goals Time to Complete Long Term Goals: 6 weeks Long Term Goal 1: Caregiver will verbalize 75% decreased drops in functional activities. Long Term Goal 2: Pt will improve bilateral grip strength by atleast 8 lbs Long Term Goal 3: Pt will improve pinch strength by atleast 6 lbs Long Term Goal 4: Pt wil demonstrate ability to use utensil for 25% bites when eating.  Problem List Patient Active Problem List   Diagnosis Date Noted  . Decreased grip strength 02/12/2014  . Decreased pinch strength 02/12/2014  . 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 Autism 02/22/2012  . ADHD, hyperactive-impulsive type 02/22/2012    End of Session Activity Tolerance: Patient tolerated treatment well General Behavior During Therapy: [facility] for tasks assessed/performed OT Plan of Care OT Home Exercise Plan: To be given at next session - assess pt's use of theraputty Consulted and Agree with Plan of Care: Family member/caregiver Family  Member Consulted: Pharmacist, community at [place] Functional Assessment Tool Used: Clinical Judgement Functional Limitation: Carrying, moving and handling objects Carrying, Moving and Handling Objects Current Status [phone]): At least 40 percent but less than 60 percent impaired, limited or restricted Carrying, Moving and Handling Objects Goal Status [phone]): At least 1 percent but less than 20 percent impaired, limited or restricted   Bea Graff, [place], OTR/L [phone]  02/12/2014, 5:43 PM  Physician Documentation Your signature is required to indicate approval of the treatment plan as stated above.  Please sign and either send electronically or make a copy of this report for your files and return this physician signed original.  Please mark one 1.__approve of plan  2. ___approve of plan with the following conditions.   ______________________________                                                          _____________________ Physician Signature  Date  

## 2014-02-26 ENCOUNTER — Inpatient Hospital Stay (HOSPITAL_COMMUNITY): Admission: RE | Admit: 2014-02-26 | Payer: Self-pay | Source: Ambulatory Visit

## 2014-02-26 ENCOUNTER — Telehealth (HOSPITAL_COMMUNITY): Payer: Self-pay

## 2014-03-05 ENCOUNTER — Ambulatory Visit (HOSPITAL_COMMUNITY): Payer: Self-pay

## 2014-03-12 ENCOUNTER — Inpatient Hospital Stay (HOSPITAL_COMMUNITY): Admission: RE | Admit: 2014-03-12 | Payer: Self-pay | Source: Ambulatory Visit

## 2014-03-19 ENCOUNTER — Ambulatory Visit (HOSPITAL_COMMUNITY): Payer: Self-pay

## 2014-04-02 ENCOUNTER — Ambulatory Visit (HOSPITAL_COMMUNITY): Payer: Self-pay

## 2014-05-28 ENCOUNTER — Ambulatory Visit (HOSPITAL_COMMUNITY)
Admission: RE | Admit: 2014-05-28 | Discharge: 2014-05-28 | Disposition: A | Discharge status: Home / Self Care | Payer: Medicare Other | Source: Ambulatory Visit | Attending: Internal Medicine | Admitting: Internal Medicine

## 2014-05-28 DIAGNOSIS — F802 Mixed receptive-expressive language disorder: Secondary | ICD-10-CM | POA: Insufficient documentation

## 2014-05-28 DIAGNOSIS — F840 Autistic disorder: Secondary | ICD-10-CM | POA: Diagnosis not present

## 2014-05-28 DIAGNOSIS — IMO0001 Reserved for inherently not codable concepts without codable children: Secondary | ICD-10-CM | POA: Diagnosis not present

## 2014-05-28 DIAGNOSIS — R279 Unspecified lack of coordination: Secondary | ICD-10-CM | POA: Insufficient documentation

## 2014-05-28 DIAGNOSIS — M6281 Muscle weakness (generalized): Secondary | ICD-10-CM | POA: Diagnosis not present

## 2014-05-28 DIAGNOSIS — F801 Expressive language disorder: Secondary | ICD-10-CM | POA: Diagnosis not present

## 2014-05-28 NOTE — Evaluation (Signed)
Occupational Therapy Evaluation and Discharge  Patient Details  Name: Adam Nichols MRN: [id number] Date of Birth: 10-02-1991  Today's Date: 05/28/2014 Time: 3748-2707 OT Time Calculation (min): 25 min Eval 25'  Visit#: 1 of 1  Re-eval:    Assessment Diagnosis: Autism - decrease grip strength  Authorization: medicare  Authorization Time Period:    Authorization Visit#:   of     Past Medical History:  Past Medical History  Diagnosis Date  . Autism    Past Surgical History: No past surgical history on file.  Subjective Symptoms/Limitations Symptoms: S: "uhh" Pertinent History: Pt is 23 year old male with dx of autism who is presenting for OT evalaution from [place], with two individuals who work with him at the group home.  Pt began residing at the group home in [place] after previously living with his mother.  Pt was apparenlty becoming more combative and causing personal and property damange when living with his mom, who determined it would be best for him to live elsewhere.  Also important to note is that pt prefers things to be closed (doors, cabinets, boxes), that he has significant rocking behaviors, and that he has minimal verbal skills.  pt is presenting to outpatient OT with concerns for wrist strength.  Pain Assessment Currently in Pain?: No/denies  Precautions/Restrictions  Precautions Precautions: None Restrictions Weight Bearing Restrictions: No  Balance Screening Balance Screen Has the patient fallen in the past 6 months: No  Prior [place] expects to be discharged to:: Group home Additional Comments: [facility] since November Prior Function Level of Independence: Needs assistance with ADLs;Needs assistance with homemaking  Assessment ADL/Vision/Perception ADL ADL Comments: Pt has improved since previous evaluation in April.  he has become calmer and has improved following of directions. he has  begun suing spoons and forks to eat, but still drops food items. Dominant Hand: Right  Cognition/Observation Cognition Overall Cognitive Status: History of cognitive impairments - at baseline Observation/Other Assessments Observations: Pt has slighly enlarged ulnar prominance on right wrist.  [place] due to constance holding and flexing/extending. pt doe snot indicated any pain in wrist, even with palpation.  Sensation/Coordination/Edema Coordination Gross Motor Movements are Fluid and Coordinated: Yes Fine Motor Movements are Fluid and Coordinated: Yes 9 Hole Peg Test: right 16.45 seconds to place pegs in    Left 23.12 seconds to palce pegs in  Additional Assessments RUE AROM (degrees) Right Wrist Extension: 68 Degrees Right Wrist Flexion: 70 Degrees RUE Strength Grip (lbs): 81 Lateral Pinch: 9 lbs 3 Point Pinch: 6 lbs LUE AROM (degrees) Left Wrist Extension: 71 Degrees Left Wrist Flexion: 70 Degrees LUE Strength Grip (lbs): 43 Lateral Pinch: 11 lbs 3 Point Pinch: 7 lbs Palpation Palpation: n onoted fascial restricions in right wrist Right Hand Strength - Pinch (lbs) Lateral Pinch: 9 lbs 3 Point Pinch: 6 lbs Left Hand Strength - Pinch (lbs) Lateral Pinch: 11 lbs 3 Point Pinch: 7 lbs    Occupational Therapy Assessment and Plan OT Assessment and Plan Clinical Impression Statement: pt is presenting for outpatient OT evaluation for wrist and grip strenghtening.  Pt is presenting with some decreased grip strength, but has good ROM otherwise. Provided pt/caregivers with HEP for improved hand and grip strengthening. Caregiver verbalized understanidng. Pt will benefit from exericses done in the ome environemtn, and aregiver was encouraged to call with any questions. No further outpatinet OT needed at this time. OT Plan: Provided HEP. Pt is discharged from outpatient OT  Goals Short Term Goals Short Term Goal 1: Caregivers will be educated on HEP  Problem List Patient Active  Problem List   Diagnosis Date Noted  . Decreased grip strength 02/12/2014  . Decreased pinch strength 02/12/2014  . 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 Autism 02/22/2012  . ADHD, hyperactive-impulsive type 02/22/2012    End of Session Activity Tolerance: Patient tolerated treatment well General Behavior During Therapy: [facility] for tasks assessed/performed OT Plan of Care OT Home Exercise Plan: Green theraputty and fine motor coordination exercises OT Patient Instructions: handouts provided Consulted and Agree with Plan of Care: Family member/caregiver  GO Functional Assessment Tool Used: Clinical Judement Functional Limitation: Self care Self Care Current Status [phone]): At least 20 percent but less than 40 percent impaired, limited or restricted Self Care Goal Status ([id number]): At least 20 percent but less than 40 percent impaired, limited or restricted Self Care Discharge Status [phone]): At least 20 percent but less than 40 percent impaired, limited or restricted  Bea Graff, [place], OTR/L [phone]  05/28/2014, 5:31 PM  Physician Documentation Your signature is required to indicate approval of the treatment plan as stated above.  Please sign and either send electronically or make a copy of this report for your files and return this physician signed original.  Please mark one 1.__approve of plan  2. ___approve of plan with the following conditions.   ______________________________                                                          _____________________ Physician Signature                                                                                                             Date

## 2014-05-28 NOTE — Evaluation (Signed)
Speech Language Pathology Evaluation Patient Details  Name: Adam Nichols MRN: [id number] Date of Birth: Aug 19, 1991  Today's Date: 05/28/2014 Time: 8101-7510 SLP Time Calculation (min): 27 min  Authorization: Medicare/Medicaid  Authorization Time Period:    Authorization Visit#:  1 of   1  Past Medical History:  Past Medical History  Diagnosis Date  . Autism    Past Surgical History: No past surgical history on file. HPI:  HPI: Adam Nichols is a 23 year old black male who has Autism and currently resides at [facility] (since December). Previously he lived with his mother and 54 year old brother in [place]. He attended [employer] school and a developmental disability program. The patient has a long history of autistic disorder ADHD and probable developmental cognitive impairment. He moved out of his mother's home because he had become very agitated and out of control. He is referred by Dr. Eula Fried for a speech/language evaluation to determine if his communication skills can be enhanced. He was accompanied by two caregivers from [facility] today for the evaluation. They report that he will follow simple commands and use some words/signs to express his needs/wants. Symptoms/Limitations Symptoms: "Food" Special Tests: informal measures Pain Assessment Currently in Pain?: No/denies  Prior Functional Status  Cognitive/Linguistic Baseline: Baseline deficits Baseline deficit details: Autism with severe cognitive communication deficits Type of Home: Group Home Available Help at Discharge: Available 24 hours/day Vocation: On disability  Balance Screening Balance Screen Has the patient fallen in the past 6 months: No Has the patient had a decrease in activity level because of a fear of falling? : No Is the patient reluctant to leave their home because of a fear of falling? : No  Cognition  Overall Cognitive Status: History of cognitive impairments - at  baseline Arousal/Alertness: Awake/alert Orientation Level: Oriented to person Attention: Sustained Sustained Attention: Appears intact Memory:  (unable to assess) Problem Solving: Impaired Problem Solving Impairment: Verbal basic;Functional basic Executive Function: Reasoning;Sequencing;Organizing;Decision Making;Initiating;Self Monitoring;Self Correcting Reasoning: Impaired Reasoning Impairment: Verbal basic Sequencing: Impaired Sequencing Impairment: Verbal basic Organizing: Impaired Organizing Impairment: Verbal basic Decision Making: Impaired Decision Making Impairment: Verbal basic Initiating: Impaired Initiating Impairment: Verbal basic Self Monitoring: Impaired Self Monitoring Impairment: Verbal basic Self Correcting: Impaired Self Correcting Impairment: Verbal basic Behaviors: Impulsive Safety/Judgment: Impaired (needs supervision) Comments: difficult to formally assess due to severity of cognitive communication deficits  Comprehension  Auditory Comprehension Overall Auditory Comprehension: Impaired Yes/No Questions: Impaired Basic Biographical Questions: 26-50% accurate Basic Immediate Environment Questions: 25-49% accurate Commands: Impaired One Step Basic Commands: 75-100% accurate Two Step Basic Commands: 0-24% accurate Conversation: Simple Interfering Components: Attention EffectiveTechniques: Repetition;Visual/Gestural cues Visual Recognition/Discrimination Discrimination: Within Function Limits Reading Comprehension Reading Status: Not tested  Expression  Expression Primary Mode of Expression: Verbal (limited verbal and nonverbal communication) Verbal Expression Overall Verbal Expression: Impaired Initiation: Impaired Level of Generative/Spontaneous Verbalization: Word Repetition: Impaired Level of Impairment: Word level Naming: Impairment Responsive: 0-25% accurate Confrontation: Impaired Convergent: 0-24% accurate Pragmatics:  Impairment Impairments: Abnormal affect;Eye contact;Interpretation of nonverbal communication;Monotone;Turn Taking Interfering Components: Attention;Premorbid deficit;Speech intelligibility Effective Techniques:  (signing prompts verbal response at times) Non-Verbal Means of Communication: Gestures Written Expression Dominant Hand: Right Written Expression: Not tested  Oral/Motor  Oral Motor/Sensory Function Overall Oral Motor/Sensory Function: Other (comment) (unable to formally assess) Motor Speech Overall Motor Speech: Impaired Respiration: Within functional limits Phonation: Normal Resonance: Within functional limits Articulation: Impaired Level of Impairment: Word Intelligibility: Intelligibility reduced Word: 50-74% accurate (in high context situations) Motor Planning: Not tested Motor Speech Errors: Not applicable  Interfering Components: Premorbid status Effective Techniques: Slow rate  SLP Goals   N/A   Assessment/Plan  Patient Active Problem List   Diagnosis Date Noted  . Receptive expressive language disorder 05/28/2014  . Decreased grip strength 02/12/2014  . Decreased pinch strength 02/12/2014  . 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 Autism 02/22/2012  . ADHD, hyperactive-impulsive type 02/22/2012   SLP - End of Session Activity Tolerance: Patient tolerated treatment well General Behavior During Therapy: WFL for tasks assessed/performed  SLP Assessment/Plan Clinical Impression Statement: Adam Nichols presents with severe a expressive/receptive language disorder in setting of Autism characterized by limited verbal output, use of some signs, difficulty expressing wants/needs, and difficulty following 2-step directions. He tends to communicate when he is motivated to. For example, he states "food", "drink", "sleep" when he wants any of those things. He will also notify caregivers when he needs to use the toilet. Adam Nichols was able to follow some simple one-step directions  that involved pointing to basic body parts. He raised his head immediately when the work "food" was spoken, as he tends to be motivated by food. He used several signs today with cues (more, stop, eat, please, and thank you). Adam Nichols shows a willingness to learn when it is something of interest to him. He imitated a few new signs when modeled by SLP.   Recommend follow up SLP intervention in his home setting to maximize his independence with communication and decrease burden of care with a focus on communication enhancement via signs and verbalizations and caregiver training/education. Both caregiver and SLP believe that Adam Nichols would learn best in a familiar/home setting so that treatment can be incorporated into relevant activities and established routines already in place at the home. If they are unable to find SLP to see him in home setting, consider a trial of speech therapy in outpatient setting.    Speech Therapy Frequency: Other (Comment) (recommend SLP therapy in home setting) Treatment/Interventions: Cueing hierarchy;Language facilitation;Compensatory strategies;SLP instruction and feedback;Patient/family education;Multimodal communcation approach;Internal/external aids Potential to Achieve Goals: Fair Potential Considerations: Ability to learn/carryover information;Severity of impairments;Financial resources   GN  Functional Assessment Tool Used: clinical judgement Functional Limitations: Spoken language expressive Spoken Language Expression Current Status [id number]): At least 60 percent but less than 80 percent impaired, limited or restricted Spoken Language Expression Goal Status [phone]): At least 40 percent but less than 60 percent impaired, limited or restricted Spoken Language Expression Discharge Status [phone]): At least 60 percent but less than 80 percent impaired, limited or restricted  Thank you,  Genene Churn, [place]  [place] 05/28/2014, 6:58  PM  Physician Documentation Your signature is required to indicate approval of the treatment plan as stated above.  Please sign and either send electronically or make a copy of this report for your files and return this physician signed original.  Please mark one 1.__approve of plan  2. ___approve of plan with the following conditions.   ______________________________                                                          _____________________ Physician Signature  Date  

## 2015-01-29 IMAGING — CR DG CHEST 1V
1 series · 1 of 1 positions shown · non-contrast
Comparison: None.

CLINICAL DATA: Cough

EXAM:
CHEST - 1 VIEW

[view not recorded]
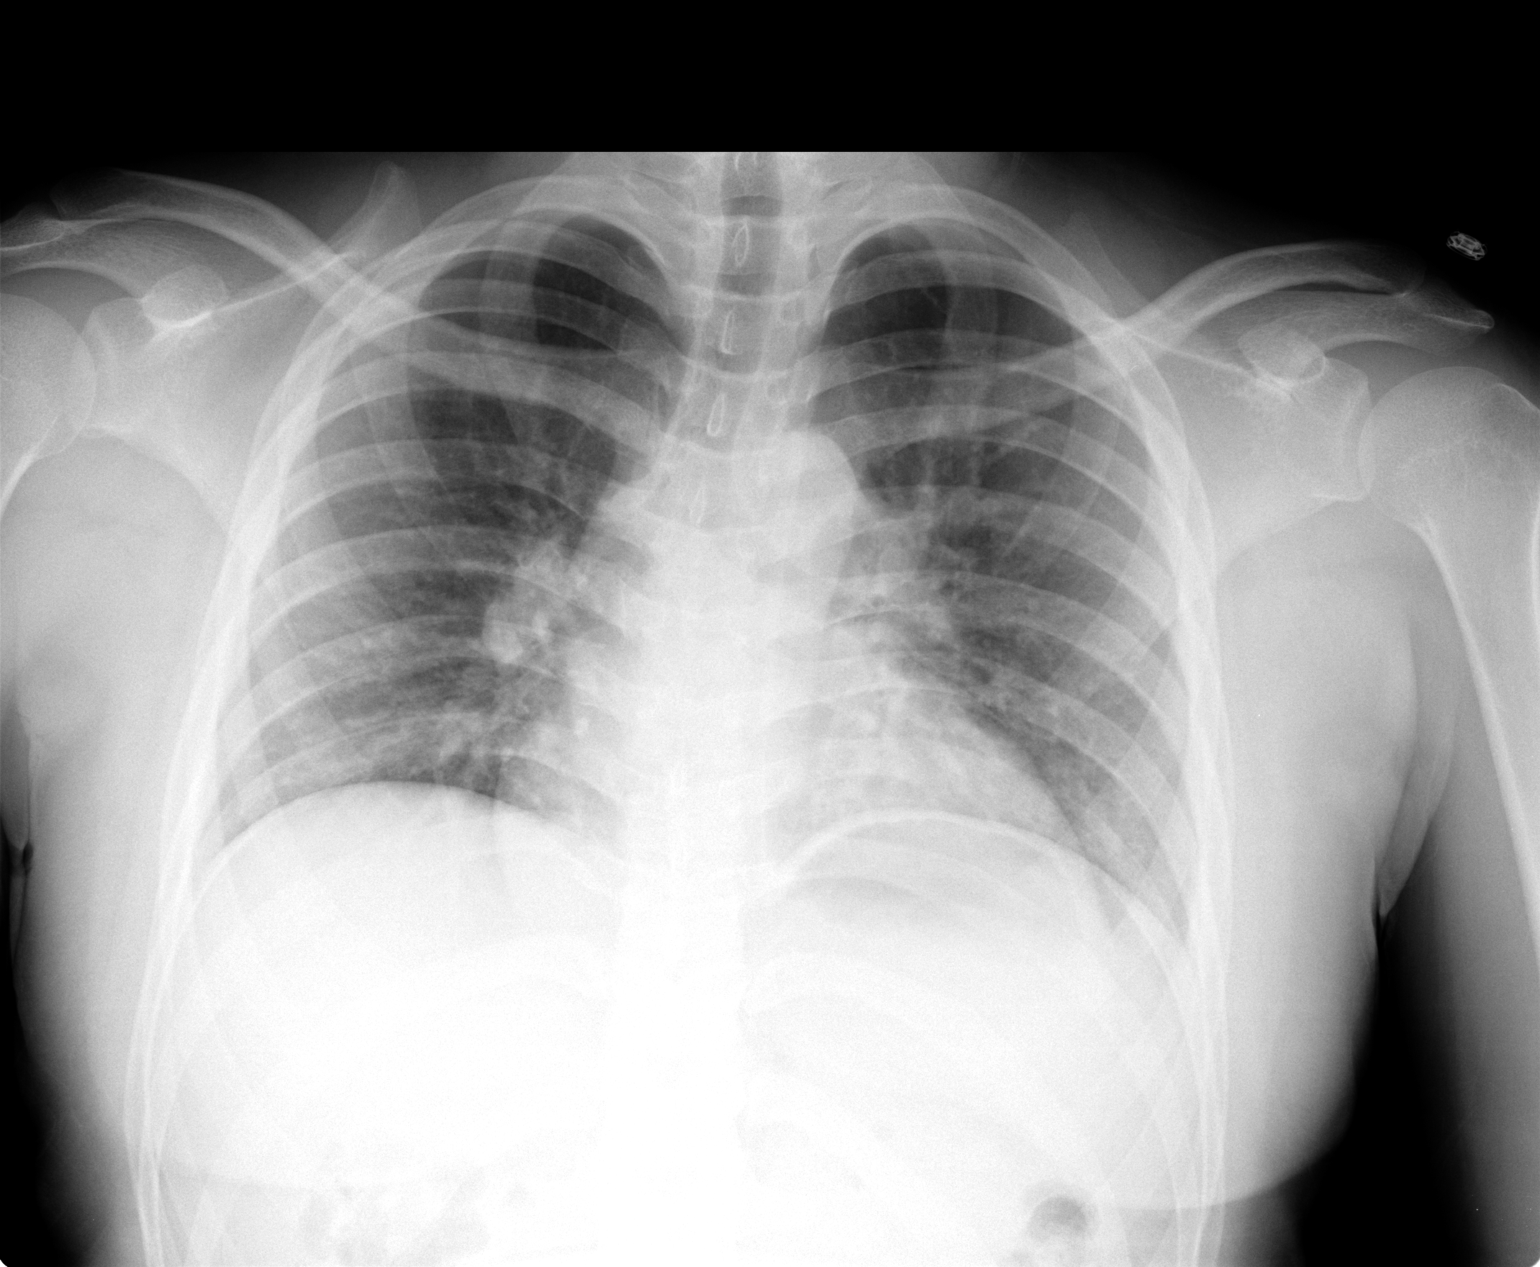

[1 of 1 positions shown; findings below may reference images not displayed]

FINDINGS: Cardiomediastinal silhouette is unremarkable. No acute infiltrate or
pleural effusion. No pulmonary edema. Minimal perihilar increased
bronchial markings without focal consolidation.
IMPRESSION: No acute infiltrate or pulmonary edema. Minimal perihilar increased
bronchial markings without focal consolidation.

## 2015-11-20 DIAGNOSIS — F72 Severe intellectual disabilities: Secondary | ICD-10-CM | POA: Diagnosis not present

## 2015-12-01 DIAGNOSIS — Z5181 Encounter for therapeutic drug level monitoring: Secondary | ICD-10-CM | POA: Diagnosis not present

## 2015-12-01 DIAGNOSIS — Z79899 Other long term (current) drug therapy: Secondary | ICD-10-CM | POA: Diagnosis not present

## 2016-01-30 DIAGNOSIS — G44219 Episodic tension-type headache, not intractable: Secondary | ICD-10-CM | POA: Diagnosis not present

## 2016-02-05 DIAGNOSIS — Z833 Family history of diabetes mellitus: Secondary | ICD-10-CM | POA: Diagnosis not present

## 2016-02-05 DIAGNOSIS — Z79899 Other long term (current) drug therapy: Secondary | ICD-10-CM | POA: Diagnosis not present

## 2016-02-05 DIAGNOSIS — R5381 Other malaise: Secondary | ICD-10-CM | POA: Diagnosis not present

## 2016-02-05 DIAGNOSIS — I259 Chronic ischemic heart disease, unspecified: Secondary | ICD-10-CM | POA: Diagnosis not present

## 2016-02-05 DIAGNOSIS — E119 Type 2 diabetes mellitus without complications: Secondary | ICD-10-CM | POA: Diagnosis not present

## 2016-02-05 DIAGNOSIS — E559 Vitamin D deficiency, unspecified: Secondary | ICD-10-CM | POA: Diagnosis not present

## 2016-02-05 DIAGNOSIS — E569 Vitamin deficiency, unspecified: Secondary | ICD-10-CM | POA: Diagnosis not present

## 2016-02-05 DIAGNOSIS — I10 Essential (primary) hypertension: Secondary | ICD-10-CM | POA: Diagnosis not present

## 2016-02-05 DIAGNOSIS — R531 Weakness: Secondary | ICD-10-CM | POA: Diagnosis not present

## 2016-02-12 DIAGNOSIS — F72 Severe intellectual disabilities: Secondary | ICD-10-CM | POA: Diagnosis not present

## 2016-03-23 DIAGNOSIS — F72 Severe intellectual disabilities: Secondary | ICD-10-CM | POA: Diagnosis not present

## 2016-06-01 DIAGNOSIS — F72 Severe intellectual disabilities: Secondary | ICD-10-CM | POA: Diagnosis not present

## 2016-08-31 DIAGNOSIS — F72 Severe intellectual disabilities: Secondary | ICD-10-CM | POA: Diagnosis not present

## 2016-09-01 DIAGNOSIS — Z23 Encounter for immunization: Secondary | ICD-10-CM | POA: Diagnosis not present

## 2016-11-30 DIAGNOSIS — F72 Severe intellectual disabilities: Secondary | ICD-10-CM | POA: Diagnosis not present

## 2017-02-17 DIAGNOSIS — F72 Severe intellectual disabilities: Secondary | ICD-10-CM | POA: Diagnosis not present

## 2017-02-22 DIAGNOSIS — F840 Autistic disorder: Secondary | ICD-10-CM | POA: Diagnosis not present

## 2017-02-22 DIAGNOSIS — F419 Anxiety disorder, unspecified: Secondary | ICD-10-CM | POA: Diagnosis not present

## 2017-02-22 DIAGNOSIS — Z789 Other specified health status: Secondary | ICD-10-CM | POA: Diagnosis not present

## 2017-02-22 DIAGNOSIS — Z299 Encounter for prophylactic measures, unspecified: Secondary | ICD-10-CM | POA: Diagnosis not present

## 2017-02-22 DIAGNOSIS — G4700 Insomnia, unspecified: Secondary | ICD-10-CM | POA: Diagnosis not present

## 2017-02-22 DIAGNOSIS — Z6822 Body mass index (BMI) 22.0-22.9, adult: Secondary | ICD-10-CM | POA: Diagnosis not present

## 2017-03-24 DIAGNOSIS — G4700 Insomnia, unspecified: Secondary | ICD-10-CM | POA: Diagnosis not present

## 2017-03-24 DIAGNOSIS — F419 Anxiety disorder, unspecified: Secondary | ICD-10-CM | POA: Diagnosis not present

## 2017-03-24 DIAGNOSIS — Z6822 Body mass index (BMI) 22.0-22.9, adult: Secondary | ICD-10-CM | POA: Diagnosis not present

## 2017-03-24 DIAGNOSIS — F840 Autistic disorder: Secondary | ICD-10-CM | POA: Diagnosis not present

## 2017-03-24 DIAGNOSIS — Z299 Encounter for prophylactic measures, unspecified: Secondary | ICD-10-CM | POA: Diagnosis not present

## 2017-04-19 DIAGNOSIS — F419 Anxiety disorder, unspecified: Secondary | ICD-10-CM | POA: Diagnosis not present

## 2017-04-19 DIAGNOSIS — Z6832 Body mass index (BMI) 32.0-32.9, adult: Secondary | ICD-10-CM | POA: Diagnosis not present

## 2017-04-19 DIAGNOSIS — G4700 Insomnia, unspecified: Secondary | ICD-10-CM | POA: Diagnosis not present

## 2017-04-19 DIAGNOSIS — F840 Autistic disorder: Secondary | ICD-10-CM | POA: Diagnosis not present

## 2017-04-19 DIAGNOSIS — Z299 Encounter for prophylactic measures, unspecified: Secondary | ICD-10-CM | POA: Diagnosis not present

## 2017-04-19 DIAGNOSIS — Z79899 Other long term (current) drug therapy: Secondary | ICD-10-CM | POA: Diagnosis not present

## 2017-06-16 DIAGNOSIS — F72 Severe intellectual disabilities: Secondary | ICD-10-CM | POA: Diagnosis not present

## 2017-07-08 DIAGNOSIS — F419 Anxiety disorder, unspecified: Secondary | ICD-10-CM | POA: Diagnosis not present

## 2017-07-08 DIAGNOSIS — Z79899 Other long term (current) drug therapy: Secondary | ICD-10-CM | POA: Diagnosis not present

## 2017-07-26 DIAGNOSIS — F419 Anxiety disorder, unspecified: Secondary | ICD-10-CM | POA: Diagnosis not present

## 2017-07-26 DIAGNOSIS — Z299 Encounter for prophylactic measures, unspecified: Secondary | ICD-10-CM | POA: Diagnosis not present

## 2017-07-26 DIAGNOSIS — G4700 Insomnia, unspecified: Secondary | ICD-10-CM | POA: Diagnosis not present

## 2017-07-26 DIAGNOSIS — F840 Autistic disorder: Secondary | ICD-10-CM | POA: Diagnosis not present

## 2017-07-26 DIAGNOSIS — Z6822 Body mass index (BMI) 22.0-22.9, adult: Secondary | ICD-10-CM | POA: Diagnosis not present

## 2017-08-12 DIAGNOSIS — F72 Severe intellectual disabilities: Secondary | ICD-10-CM | POA: Diagnosis not present

## 2017-08-24 DIAGNOSIS — Z789 Other specified health status: Secondary | ICD-10-CM | POA: Diagnosis not present

## 2017-08-24 DIAGNOSIS — Z6822 Body mass index (BMI) 22.0-22.9, adult: Secondary | ICD-10-CM | POA: Diagnosis not present

## 2017-08-24 DIAGNOSIS — J069 Acute upper respiratory infection, unspecified: Secondary | ICD-10-CM | POA: Diagnosis not present

## 2017-08-24 DIAGNOSIS — F419 Anxiety disorder, unspecified: Secondary | ICD-10-CM | POA: Diagnosis not present

## 2017-08-24 DIAGNOSIS — Z299 Encounter for prophylactic measures, unspecified: Secondary | ICD-10-CM | POA: Diagnosis not present

## 2017-08-31 DIAGNOSIS — Z7189 Other specified counseling: Secondary | ICD-10-CM | POA: Diagnosis not present

## 2017-08-31 DIAGNOSIS — Z299 Encounter for prophylactic measures, unspecified: Secondary | ICD-10-CM | POA: Diagnosis not present

## 2017-08-31 DIAGNOSIS — Z0000 Encounter for general adult medical examination without abnormal findings: Secondary | ICD-10-CM | POA: Diagnosis not present

## 2017-08-31 DIAGNOSIS — Z1331 Encounter for screening for depression: Secondary | ICD-10-CM | POA: Diagnosis not present

## 2017-08-31 DIAGNOSIS — Z6822 Body mass index (BMI) 22.0-22.9, adult: Secondary | ICD-10-CM | POA: Diagnosis not present

## 2017-08-31 DIAGNOSIS — F840 Autistic disorder: Secondary | ICD-10-CM | POA: Diagnosis not present

## 2017-08-31 DIAGNOSIS — F419 Anxiety disorder, unspecified: Secondary | ICD-10-CM | POA: Diagnosis not present

## 2017-08-31 DIAGNOSIS — Z1339 Encounter for screening examination for other mental health and behavioral disorders: Secondary | ICD-10-CM | POA: Diagnosis not present

## 2017-09-07 DIAGNOSIS — F72 Severe intellectual disabilities: Secondary | ICD-10-CM | POA: Diagnosis not present

## 2017-12-08 DIAGNOSIS — F840 Autistic disorder: Secondary | ICD-10-CM | POA: Diagnosis not present

## 2018-01-02 DIAGNOSIS — Z299 Encounter for prophylactic measures, unspecified: Secondary | ICD-10-CM | POA: Diagnosis not present

## 2018-01-02 DIAGNOSIS — Z789 Other specified health status: Secondary | ICD-10-CM | POA: Diagnosis not present

## 2018-01-02 DIAGNOSIS — Z6820 Body mass index (BMI) 20.0-20.9, adult: Secondary | ICD-10-CM | POA: Diagnosis not present

## 2018-01-02 DIAGNOSIS — I10 Essential (primary) hypertension: Secondary | ICD-10-CM | POA: Diagnosis not present

## 2018-01-02 DIAGNOSIS — L299 Pruritus, unspecified: Secondary | ICD-10-CM | POA: Diagnosis not present

## 2018-02-01 DIAGNOSIS — F840 Autistic disorder: Secondary | ICD-10-CM | POA: Diagnosis not present

## 2018-03-10 NOTE — Telephone Encounter (Signed)
Done

## 2018-05-02 DIAGNOSIS — F840 Autistic disorder: Secondary | ICD-10-CM | POA: Diagnosis not present

## 2018-08-01 DIAGNOSIS — I10 Essential (primary) hypertension: Secondary | ICD-10-CM | POA: Diagnosis not present

## 2018-08-01 DIAGNOSIS — Z23 Encounter for immunization: Secondary | ICD-10-CM | POA: Diagnosis not present

## 2018-08-01 DIAGNOSIS — Z1331 Encounter for screening for depression: Secondary | ICD-10-CM | POA: Diagnosis not present

## 2018-08-01 DIAGNOSIS — Z299 Encounter for prophylactic measures, unspecified: Secondary | ICD-10-CM | POA: Diagnosis not present

## 2018-08-01 DIAGNOSIS — Z1339 Encounter for screening examination for other mental health and behavioral disorders: Secondary | ICD-10-CM | POA: Diagnosis not present

## 2018-08-01 DIAGNOSIS — Z0000 Encounter for general adult medical examination without abnormal findings: Secondary | ICD-10-CM | POA: Diagnosis not present

## 2018-08-01 DIAGNOSIS — F840 Autistic disorder: Secondary | ICD-10-CM | POA: Diagnosis not present

## 2018-08-01 DIAGNOSIS — Z6820 Body mass index (BMI) 20.0-20.9, adult: Secondary | ICD-10-CM | POA: Diagnosis not present

## 2018-08-02 DIAGNOSIS — F840 Autistic disorder: Secondary | ICD-10-CM | POA: Diagnosis not present

## 2018-08-03 DIAGNOSIS — Z79899 Other long term (current) drug therapy: Secondary | ICD-10-CM | POA: Diagnosis not present

## 2018-08-30 DIAGNOSIS — F840 Autistic disorder: Secondary | ICD-10-CM | POA: Diagnosis not present

## 2018-10-13 DIAGNOSIS — Z299 Encounter for prophylactic measures, unspecified: Secondary | ICD-10-CM | POA: Diagnosis not present

## 2018-10-13 DIAGNOSIS — Z6820 Body mass index (BMI) 20.0-20.9, adult: Secondary | ICD-10-CM | POA: Diagnosis not present

## 2018-10-13 DIAGNOSIS — I10 Essential (primary) hypertension: Secondary | ICD-10-CM | POA: Diagnosis not present

## 2018-10-13 DIAGNOSIS — D229 Melanocytic nevi, unspecified: Secondary | ICD-10-CM | POA: Diagnosis not present

## 2018-10-20 DIAGNOSIS — F840 Autistic disorder: Secondary | ICD-10-CM | POA: Diagnosis not present

## 2019-01-12 DIAGNOSIS — Z6820 Body mass index (BMI) 20.0-20.9, adult: Secondary | ICD-10-CM | POA: Diagnosis not present

## 2019-01-12 DIAGNOSIS — F840 Autistic disorder: Secondary | ICD-10-CM | POA: Diagnosis not present

## 2019-01-12 DIAGNOSIS — Z299 Encounter for prophylactic measures, unspecified: Secondary | ICD-10-CM | POA: Diagnosis not present

## 2019-01-12 DIAGNOSIS — I10 Essential (primary) hypertension: Secondary | ICD-10-CM | POA: Diagnosis not present

## 2019-01-30 DIAGNOSIS — F840 Autistic disorder: Secondary | ICD-10-CM | POA: Diagnosis not present

## 2019-03-22 DIAGNOSIS — Z713 Dietary counseling and surveillance: Secondary | ICD-10-CM | POA: Diagnosis not present

## 2019-03-22 DIAGNOSIS — I10 Essential (primary) hypertension: Secondary | ICD-10-CM | POA: Diagnosis not present

## 2019-03-22 DIAGNOSIS — Z299 Encounter for prophylactic measures, unspecified: Secondary | ICD-10-CM | POA: Diagnosis not present

## 2019-03-22 DIAGNOSIS — Z6820 Body mass index (BMI) 20.0-20.9, adult: Secondary | ICD-10-CM | POA: Diagnosis not present

## 2019-03-22 DIAGNOSIS — F840 Autistic disorder: Secondary | ICD-10-CM | POA: Diagnosis not present

## 2019-05-03 DIAGNOSIS — F840 Autistic disorder: Secondary | ICD-10-CM | POA: Diagnosis not present

## 2019-08-03 DIAGNOSIS — Z7189 Other specified counseling: Secondary | ICD-10-CM | POA: Diagnosis not present

## 2019-08-03 DIAGNOSIS — Z1339 Encounter for screening examination for other mental health and behavioral disorders: Secondary | ICD-10-CM | POA: Diagnosis not present

## 2019-08-03 DIAGNOSIS — I10 Essential (primary) hypertension: Secondary | ICD-10-CM | POA: Diagnosis not present

## 2019-08-03 DIAGNOSIS — Z1331 Encounter for screening for depression: Secondary | ICD-10-CM | POA: Diagnosis not present

## 2019-08-03 DIAGNOSIS — Z299 Encounter for prophylactic measures, unspecified: Secondary | ICD-10-CM | POA: Diagnosis not present

## 2019-08-03 DIAGNOSIS — Z681 Body mass index (BMI) 19 or less, adult: Secondary | ICD-10-CM | POA: Diagnosis not present

## 2019-08-03 DIAGNOSIS — Z0000 Encounter for general adult medical examination without abnormal findings: Secondary | ICD-10-CM | POA: Diagnosis not present

## 2019-08-08 DIAGNOSIS — F840 Autistic disorder: Secondary | ICD-10-CM | POA: Diagnosis not present

## 2019-08-27 DIAGNOSIS — Z23 Encounter for immunization: Secondary | ICD-10-CM | POA: Diagnosis not present

## 2019-10-08 DIAGNOSIS — I10 Essential (primary) hypertension: Secondary | ICD-10-CM | POA: Diagnosis not present

## 2019-10-08 DIAGNOSIS — Z79899 Other long term (current) drug therapy: Secondary | ICD-10-CM | POA: Diagnosis not present

## 2019-11-23 DIAGNOSIS — F840 Autistic disorder: Secondary | ICD-10-CM | POA: Diagnosis not present

## 2020-01-14 DIAGNOSIS — Z23 Encounter for immunization: Secondary | ICD-10-CM | POA: Diagnosis not present

## 2020-02-01 DIAGNOSIS — Z789 Other specified health status: Secondary | ICD-10-CM | POA: Diagnosis not present

## 2020-02-01 DIAGNOSIS — I10 Essential (primary) hypertension: Secondary | ICD-10-CM | POA: Diagnosis not present

## 2020-02-01 DIAGNOSIS — F840 Autistic disorder: Secondary | ICD-10-CM | POA: Diagnosis not present

## 2020-02-01 DIAGNOSIS — Z299 Encounter for prophylactic measures, unspecified: Secondary | ICD-10-CM | POA: Diagnosis not present

## 2020-02-12 DIAGNOSIS — Z23 Encounter for immunization: Secondary | ICD-10-CM | POA: Diagnosis not present

## 2020-02-26 DIAGNOSIS — F840 Autistic disorder: Secondary | ICD-10-CM | POA: Diagnosis not present

## 2020-03-07 DIAGNOSIS — Z299 Encounter for prophylactic measures, unspecified: Secondary | ICD-10-CM | POA: Diagnosis not present

## 2020-03-07 DIAGNOSIS — F840 Autistic disorder: Secondary | ICD-10-CM | POA: Diagnosis not present

## 2020-03-07 DIAGNOSIS — I10 Essential (primary) hypertension: Secondary | ICD-10-CM | POA: Diagnosis not present

## 2020-03-07 DIAGNOSIS — F72 Severe intellectual disabilities: Secondary | ICD-10-CM | POA: Diagnosis not present

## 2020-05-28 DIAGNOSIS — F840 Autistic disorder: Secondary | ICD-10-CM | POA: Diagnosis not present

## 2020-06-12 DIAGNOSIS — F840 Autistic disorder: Secondary | ICD-10-CM | POA: Diagnosis not present

## 2020-06-12 DIAGNOSIS — Z299 Encounter for prophylactic measures, unspecified: Secondary | ICD-10-CM | POA: Diagnosis not present

## 2020-06-12 DIAGNOSIS — Z681 Body mass index (BMI) 19 or less, adult: Secondary | ICD-10-CM | POA: Diagnosis not present

## 2020-06-12 DIAGNOSIS — F72 Severe intellectual disabilities: Secondary | ICD-10-CM | POA: Diagnosis not present

## 2020-06-12 DIAGNOSIS — I10 Essential (primary) hypertension: Secondary | ICD-10-CM | POA: Diagnosis not present

## 2020-07-08 DIAGNOSIS — F72 Severe intellectual disabilities: Secondary | ICD-10-CM | POA: Diagnosis not present

## 2020-07-08 DIAGNOSIS — F840 Autistic disorder: Secondary | ICD-10-CM | POA: Diagnosis not present

## 2020-07-08 DIAGNOSIS — I10 Essential (primary) hypertension: Secondary | ICD-10-CM | POA: Diagnosis not present

## 2020-07-09 DIAGNOSIS — F72 Severe intellectual disabilities: Secondary | ICD-10-CM | POA: Diagnosis not present

## 2020-07-10 DIAGNOSIS — F72 Severe intellectual disabilities: Secondary | ICD-10-CM | POA: Diagnosis not present

## 2020-08-07 DIAGNOSIS — F840 Autistic disorder: Secondary | ICD-10-CM | POA: Diagnosis not present

## 2020-08-07 DIAGNOSIS — F72 Severe intellectual disabilities: Secondary | ICD-10-CM | POA: Diagnosis not present

## 2020-08-07 DIAGNOSIS — Z299 Encounter for prophylactic measures, unspecified: Secondary | ICD-10-CM | POA: Diagnosis not present

## 2020-08-07 DIAGNOSIS — Z1331 Encounter for screening for depression: Secondary | ICD-10-CM | POA: Diagnosis not present

## 2020-08-07 DIAGNOSIS — Z1339 Encounter for screening examination for other mental health and behavioral disorders: Secondary | ICD-10-CM | POA: Diagnosis not present

## 2020-08-07 DIAGNOSIS — I10 Essential (primary) hypertension: Secondary | ICD-10-CM | POA: Diagnosis not present

## 2020-08-07 DIAGNOSIS — Z0000 Encounter for general adult medical examination without abnormal findings: Secondary | ICD-10-CM | POA: Diagnosis not present

## 2020-08-07 DIAGNOSIS — Z7189 Other specified counseling: Secondary | ICD-10-CM | POA: Diagnosis not present

## 2020-08-07 DIAGNOSIS — F419 Anxiety disorder, unspecified: Secondary | ICD-10-CM | POA: Diagnosis not present

## 2020-08-08 DIAGNOSIS — F419 Anxiety disorder, unspecified: Secondary | ICD-10-CM | POA: Diagnosis not present

## 2020-08-08 DIAGNOSIS — Z79899 Other long term (current) drug therapy: Secondary | ICD-10-CM | POA: Diagnosis not present

## 2020-08-12 DIAGNOSIS — Z23 Encounter for immunization: Secondary | ICD-10-CM | POA: Diagnosis not present

## 2020-09-05 DIAGNOSIS — F840 Autistic disorder: Secondary | ICD-10-CM | POA: Diagnosis not present

## 2020-09-05 DIAGNOSIS — F72 Severe intellectual disabilities: Secondary | ICD-10-CM | POA: Diagnosis not present

## 2020-09-05 DIAGNOSIS — I10 Essential (primary) hypertension: Secondary | ICD-10-CM | POA: Diagnosis not present

## 2020-09-17 DIAGNOSIS — F840 Autistic disorder: Secondary | ICD-10-CM | POA: Diagnosis not present

## 2020-09-23 DIAGNOSIS — Z23 Encounter for immunization: Secondary | ICD-10-CM | POA: Diagnosis not present

## 2020-10-27 DIAGNOSIS — H40033 Anatomical narrow angle, bilateral: Secondary | ICD-10-CM | POA: Diagnosis not present

## 2020-10-27 DIAGNOSIS — H04123 Dry eye syndrome of bilateral lacrimal glands: Secondary | ICD-10-CM | POA: Diagnosis not present

## 2020-11-07 DIAGNOSIS — F840 Autistic disorder: Secondary | ICD-10-CM | POA: Diagnosis not present

## 2020-11-07 DIAGNOSIS — F72 Severe intellectual disabilities: Secondary | ICD-10-CM | POA: Diagnosis not present

## 2020-11-07 DIAGNOSIS — I10 Essential (primary) hypertension: Secondary | ICD-10-CM | POA: Diagnosis not present

## 2020-12-08 DIAGNOSIS — F840 Autistic disorder: Secondary | ICD-10-CM | POA: Diagnosis not present

## 2020-12-08 DIAGNOSIS — F72 Severe intellectual disabilities: Secondary | ICD-10-CM | POA: Diagnosis not present

## 2020-12-08 DIAGNOSIS — I10 Essential (primary) hypertension: Secondary | ICD-10-CM | POA: Diagnosis not present

## 2020-12-12 DIAGNOSIS — F840 Autistic disorder: Secondary | ICD-10-CM | POA: Diagnosis not present

## 2021-01-08 DIAGNOSIS — F840 Autistic disorder: Secondary | ICD-10-CM | POA: Diagnosis not present

## 2021-02-04 DIAGNOSIS — F72 Severe intellectual disabilities: Secondary | ICD-10-CM | POA: Diagnosis not present

## 2021-02-04 DIAGNOSIS — Z789 Other specified health status: Secondary | ICD-10-CM | POA: Diagnosis not present

## 2021-02-04 DIAGNOSIS — I10 Essential (primary) hypertension: Secondary | ICD-10-CM | POA: Diagnosis not present

## 2021-02-04 DIAGNOSIS — F419 Anxiety disorder, unspecified: Secondary | ICD-10-CM | POA: Diagnosis not present

## 2021-02-04 DIAGNOSIS — Z299 Encounter for prophylactic measures, unspecified: Secondary | ICD-10-CM | POA: Diagnosis not present

## 2021-02-04 DIAGNOSIS — F840 Autistic disorder: Secondary | ICD-10-CM | POA: Diagnosis not present

## 2021-02-04 DIAGNOSIS — Z681 Body mass index (BMI) 19 or less, adult: Secondary | ICD-10-CM | POA: Diagnosis not present

## 2021-03-26 DIAGNOSIS — F840 Autistic disorder: Secondary | ICD-10-CM | POA: Diagnosis not present

## 2021-05-05 DIAGNOSIS — Z713 Dietary counseling and surveillance: Secondary | ICD-10-CM | POA: Diagnosis not present

## 2021-05-05 DIAGNOSIS — I10 Essential (primary) hypertension: Secondary | ICD-10-CM | POA: Diagnosis not present

## 2021-05-05 DIAGNOSIS — F840 Autistic disorder: Secondary | ICD-10-CM | POA: Diagnosis not present

## 2021-05-05 DIAGNOSIS — Z79899 Other long term (current) drug therapy: Secondary | ICD-10-CM | POA: Diagnosis not present

## 2021-05-05 DIAGNOSIS — Z6821 Body mass index (BMI) 21.0-21.9, adult: Secondary | ICD-10-CM | POA: Diagnosis not present

## 2021-05-05 DIAGNOSIS — Z299 Encounter for prophylactic measures, unspecified: Secondary | ICD-10-CM | POA: Diagnosis not present

## 2021-06-29 DIAGNOSIS — Z79899 Other long term (current) drug therapy: Secondary | ICD-10-CM | POA: Diagnosis not present

## 2021-06-30 DIAGNOSIS — F840 Autistic disorder: Secondary | ICD-10-CM | POA: Diagnosis not present

## 2021-08-19 DIAGNOSIS — Z6822 Body mass index (BMI) 22.0-22.9, adult: Secondary | ICD-10-CM | POA: Diagnosis not present

## 2021-08-19 DIAGNOSIS — Z7189 Other specified counseling: Secondary | ICD-10-CM | POA: Diagnosis not present

## 2021-08-19 DIAGNOSIS — Z1331 Encounter for screening for depression: Secondary | ICD-10-CM | POA: Diagnosis not present

## 2021-08-19 DIAGNOSIS — Z789 Other specified health status: Secondary | ICD-10-CM | POA: Diagnosis not present

## 2021-08-19 DIAGNOSIS — I10 Essential (primary) hypertension: Secondary | ICD-10-CM | POA: Diagnosis not present

## 2021-08-19 DIAGNOSIS — Z1339 Encounter for screening examination for other mental health and behavioral disorders: Secondary | ICD-10-CM | POA: Diagnosis not present

## 2021-08-19 DIAGNOSIS — Z299 Encounter for prophylactic measures, unspecified: Secondary | ICD-10-CM | POA: Diagnosis not present

## 2021-08-19 DIAGNOSIS — Z0000 Encounter for general adult medical examination without abnormal findings: Secondary | ICD-10-CM | POA: Diagnosis not present

## 2021-08-20 DIAGNOSIS — Z79899 Other long term (current) drug therapy: Secondary | ICD-10-CM | POA: Diagnosis not present

## 2021-09-17 DIAGNOSIS — Z23 Encounter for immunization: Secondary | ICD-10-CM | POA: Diagnosis not present

## 2021-09-18 DIAGNOSIS — U071 COVID-19: Secondary | ICD-10-CM | POA: Diagnosis not present

## 2021-09-25 DIAGNOSIS — F840 Autistic disorder: Secondary | ICD-10-CM | POA: Diagnosis not present

## 2021-11-30 DIAGNOSIS — Z789 Other specified health status: Secondary | ICD-10-CM | POA: Diagnosis not present

## 2021-11-30 DIAGNOSIS — Z299 Encounter for prophylactic measures, unspecified: Secondary | ICD-10-CM | POA: Diagnosis not present

## 2021-11-30 DIAGNOSIS — Z6822 Body mass index (BMI) 22.0-22.9, adult: Secondary | ICD-10-CM | POA: Diagnosis not present

## 2021-11-30 DIAGNOSIS — I10 Essential (primary) hypertension: Secondary | ICD-10-CM | POA: Diagnosis not present

## 2022-03-01 DIAGNOSIS — Z6822 Body mass index (BMI) 22.0-22.9, adult: Secondary | ICD-10-CM | POA: Diagnosis not present

## 2022-03-01 DIAGNOSIS — I10 Essential (primary) hypertension: Secondary | ICD-10-CM | POA: Diagnosis not present

## 2022-03-01 DIAGNOSIS — Z789 Other specified health status: Secondary | ICD-10-CM | POA: Diagnosis not present

## 2022-03-01 DIAGNOSIS — Z299 Encounter for prophylactic measures, unspecified: Secondary | ICD-10-CM | POA: Diagnosis not present

## 2022-05-14 DIAGNOSIS — Z79899 Other long term (current) drug therapy: Secondary | ICD-10-CM | POA: Diagnosis not present

## 2022-08-23 DIAGNOSIS — I10 Essential (primary) hypertension: Secondary | ICD-10-CM | POA: Diagnosis not present

## 2022-08-23 DIAGNOSIS — Z6823 Body mass index (BMI) 23.0-23.9, adult: Secondary | ICD-10-CM | POA: Diagnosis not present

## 2022-08-23 DIAGNOSIS — Z299 Encounter for prophylactic measures, unspecified: Secondary | ICD-10-CM | POA: Diagnosis not present

## 2022-08-23 DIAGNOSIS — Z7189 Other specified counseling: Secondary | ICD-10-CM | POA: Diagnosis not present

## 2022-08-23 DIAGNOSIS — Z789 Other specified health status: Secondary | ICD-10-CM | POA: Diagnosis not present

## 2022-08-23 DIAGNOSIS — Z23 Encounter for immunization: Secondary | ICD-10-CM | POA: Diagnosis not present

## 2022-08-23 DIAGNOSIS — Z0000 Encounter for general adult medical examination without abnormal findings: Secondary | ICD-10-CM | POA: Diagnosis not present

## 2022-11-23 DIAGNOSIS — I10 Essential (primary) hypertension: Secondary | ICD-10-CM | POA: Diagnosis not present

## 2022-11-23 DIAGNOSIS — Z299 Encounter for prophylactic measures, unspecified: Secondary | ICD-10-CM | POA: Diagnosis not present

## 2023-02-23 DIAGNOSIS — Z299 Encounter for prophylactic measures, unspecified: Secondary | ICD-10-CM | POA: Diagnosis not present

## 2023-02-23 DIAGNOSIS — H9193 Unspecified hearing loss, bilateral: Secondary | ICD-10-CM | POA: Diagnosis not present

## 2023-02-23 DIAGNOSIS — I10 Essential (primary) hypertension: Secondary | ICD-10-CM | POA: Diagnosis not present

## 2023-03-31 DIAGNOSIS — Z299 Encounter for prophylactic measures, unspecified: Secondary | ICD-10-CM | POA: Diagnosis not present

## 2023-03-31 DIAGNOSIS — I10 Essential (primary) hypertension: Secondary | ICD-10-CM | POA: Diagnosis not present

## 2023-04-19 DIAGNOSIS — I10 Essential (primary) hypertension: Secondary | ICD-10-CM | POA: Diagnosis not present

## 2023-04-19 DIAGNOSIS — Z299 Encounter for prophylactic measures, unspecified: Secondary | ICD-10-CM | POA: Diagnosis not present

## 2023-08-29 DIAGNOSIS — Z299 Encounter for prophylactic measures, unspecified: Secondary | ICD-10-CM | POA: Diagnosis not present

## 2023-08-29 DIAGNOSIS — Z79899 Other long term (current) drug therapy: Secondary | ICD-10-CM | POA: Diagnosis not present

## 2023-08-29 DIAGNOSIS — Z7189 Other specified counseling: Secondary | ICD-10-CM | POA: Diagnosis not present

## 2023-08-29 DIAGNOSIS — Z0000 Encounter for general adult medical examination without abnormal findings: Secondary | ICD-10-CM | POA: Diagnosis not present

## 2024-02-23 ENCOUNTER — Encounter: Payer: Self-pay | Admitting: Internal Medicine

## 2024-02-23 ENCOUNTER — Ambulatory Visit (INDEPENDENT_AMBULATORY_CARE_PROVIDER_SITE_OTHER): Payer: Self-pay | Admitting: Internal Medicine

## 2024-02-23 VITALS — BP 106/72 | HR 85 | Ht 68.0 in | Wt 167.0 lb

## 2024-02-23 DIAGNOSIS — F802 Mixed receptive-expressive language disorder: Secondary | ICD-10-CM | POA: Diagnosis not present

## 2024-02-23 DIAGNOSIS — R29898 Other symptoms and signs involving the musculoskeletal system: Secondary | ICD-10-CM

## 2024-02-23 DIAGNOSIS — F840 Autistic disorder: Secondary | ICD-10-CM

## 2024-02-23 NOTE — Assessment & Plan Note (Signed)
 Slightly decreased strength, which is chronic according to chart review If worsening, may need OT

## 2024-02-23 NOTE — Progress Notes (Signed)
 New Patient Office Visit  Subjective:  Patient ID: Adam Nichols, male    DOB: 1991/04/25  Age: 33 y.o. MRN: [id number]  CC:  Chief Complaint  Patient presents with   New Patient (Initial Visit)    Establishing care.    HPI Adam Nichols is a 33 y.o. male with past medical history of autistic disorder who presents for establishing care.  He is followed by Dr. Lynnell Grain currently.  He takes Depakote, risperidone, gabapentin, clonidine and hydroxyzine currently.  He lives at [facility] currently.  He is accompanied by his caregiver today.  He has speech disorder, overall nonverbal.  According to caregiver, he is independent with eating, bathing and clothing, but needs supervision.  He has tendency to hold his right forearm with the left hand, but no apparent shaking or seizure-like activity noted today.  No reports of agitation recently.  Past Medical History:  Diagnosis Date   Autism     History reviewed. No pertinent surgical history.  Family History  Problem Relation Age of Onset   Autism Brother     Social History   Socioeconomic History   Marital status: Single    Spouse name: Not on file   Number of children: Not on file   Years of education: Not on file   Highest education level: Not on file  Occupational History   Not on file  Tobacco Use   Smoking status: Never   Smokeless tobacco: Not on file  Substance and Sexual Activity   Alcohol use: No   Drug use: No   Sexual activity: Never  Other Topics Concern   Not on file  Social History Narrative   Not on file   Social Drivers of Health   Financial Resource Strain: Not on file  Food Insecurity: Not on file  Transportation Needs: Not on file  Physical Activity: Not on file  Stress: Not on file  Social Connections: Not on file  Intimate Partner Violence: Not on file    ROS Review of Systems  Constitutional:  Negative for chills and fever.  HENT:  Negative for congestion and sore  throat.   Eyes:  Negative for pain and discharge.  Respiratory:  Negative for cough and shortness of breath.   Cardiovascular:  Negative for chest pain and palpitations.  Gastrointestinal:  Negative for diarrhea, nausea and vomiting.  Endocrine: Negative for polydipsia and polyuria.  Genitourinary:  Negative for dysuria and hematuria.  Musculoskeletal:  Negative for neck pain and neck stiffness.  Skin:  Negative for rash.  Neurological:  Negative for dizziness, weakness, numbness and headaches.  Psychiatric/Behavioral:  Negative for agitation. The patient is nervous/anxious.     Objective:   Today's Vitals: BP 106/72   Pulse 85   Ht 5\' 8"  (1.727 m)   Wt 167 lb (75.8 kg)   SpO2 98%   BMI 25.39 kg/m   Physical Exam Vitals reviewed.  Constitutional:      General: He is not in acute distress.    Appearance: He is not diaphoretic.  HENT:     Head: Normocephalic and atraumatic.     Nose: Nose normal.     Mouth/Throat:     Mouth: Mucous membranes are moist.  Eyes:     General: No scleral icterus.    Extraocular Movements: Extraocular movements intact.  Cardiovascular:     Rate and Rhythm: Normal rate and regular rhythm.     Heart sounds: Normal heart sounds. No murmur heard.  Pulmonary:     Breath sounds: Normal breath sounds. No wheezing or rales.  Abdominal:     Palpations: Abdomen is soft.     Tenderness: There is no abdominal tenderness.  Musculoskeletal:     Cervical back: Neck supple. No tenderness.     Right lower leg: No edema.     Left lower leg: No edema.  Skin:    General: Skin is warm.     Findings: No rash.  Neurological:     General: No focal deficit present.     Mental Status: He is alert and oriented to person, place, and time.     Sensory: No sensory deficit.     Motor: Weakness (B/l UE - 4/5) present.  Psychiatric:        Mood and Affect: Mood normal.        Behavior: Behavior normal.     Assessment & Plan:   Problem List Items Addressed This  Visit       Other   Autistic disorder - Primary   Followed by psychiatry -Dr. Raelene Bullocks On risperidone 3 mg 3 times daily, Depakote 750 mg nightly, clonidine 0.1 mg 3 times daily, hydroxyzine 25 mg twice daily and gabapentin 100 mg once daily On Cogentin 1 mg twice daily      Decreased grip strength   Slightly decreased strength, which is chronic according to chart review If worsening, may need OT      Receptive expressive language disorder   Has tried speech therapy in the past Currently followed by neuropsychiatry       Outpatient Encounter Medications as of 02/23/2024  Medication Sig   benztropine (COGENTIN) 1 MG tablet Take 1 mg by mouth 2 (two) times daily.   cloNIDine (CATAPRES) 0.1 MG tablet Take 0.1 mg by mouth 3 (three) times daily.   divalproex (DEPAKOTE ER) 250 MG 24 hr tablet Take 750 mg by mouth at bedtime.   gabapentin (NEURONTIN) 100 MG capsule Take 100 mg by mouth daily.   hydrOXYzine (ATARAX) 25 MG tablet Take 25 mg by mouth 2 (two) times daily.   risperiDONE (RISPERDAL) 3 MG tablet Take 3 mg by mouth in the morning, at noon, and at bedtime.   [DISCONTINUED] clonazePAM (KLONOPIN) 0.5 MG tablet Take 1 tablet (0.5 mg total) by mouth 5 (five) times daily.   [DISCONTINUED] divalproex (DEPAKOTE SPRINKLES) 125 MG capsule Take three at bedtime   [DISCONTINUED] divalproex (DEPAKOTE) 250 MG DR tablet Take 250 mg by mouth 3 (three) times daily.   [DISCONTINUED] ARIPiprazole (ABILIFY) 10 MG tablet Take 1 tablet (10 mg total) by mouth at bedtime.   [DISCONTINUED] benztropine (COGENTIN) 1 MG tablet Take 1 tablet (1 mg total) by mouth daily.   [DISCONTINUED] traZODone (DESYREL) 100 MG tablet Take 1 tablet (100 mg total) by mouth at bedtime.   No facility-administered encounter medications on file as of 02/23/2024.    Follow-up: Return in about 6 months (around 08/24/2024) for Annual physical (after 08/28/24).   Meldon Sport, MD

## 2024-02-23 NOTE — Assessment & Plan Note (Signed)
 Followed by psychiatry -Dr. Raelene Bullocks On risperidone 3 mg 3 times daily, Depakote 750 mg nightly, clonidine 0.1 mg 3 times daily, hydroxyzine 25 mg twice daily and gabapentin 100 mg once daily On Cogentin 1 mg twice daily

## 2024-02-23 NOTE — Assessment & Plan Note (Signed)
 Has tried speech therapy in the past Currently followed by neuropsychiatry

## 2024-02-23 NOTE — Patient Instructions (Signed)
 Please continue to give medications as prescribed.  Please continue to participate in behavioral health therapy and follow up with Psychiatry.

## 2024-02-27 DIAGNOSIS — I10 Essential (primary) hypertension: Secondary | ICD-10-CM | POA: Diagnosis not present

## 2024-02-27 DIAGNOSIS — Z299 Encounter for prophylactic measures, unspecified: Secondary | ICD-10-CM | POA: Diagnosis not present

## 2024-03-15 DIAGNOSIS — S62211A Bennett's fracture, right hand, initial encounter for closed fracture: Secondary | ICD-10-CM | POA: Diagnosis not present

## 2024-03-15 DIAGNOSIS — R030 Elevated blood-pressure reading, without diagnosis of hypertension: Secondary | ICD-10-CM | POA: Diagnosis not present

## 2024-03-15 DIAGNOSIS — S92491A Other fracture of right great toe, initial encounter for closed fracture: Secondary | ICD-10-CM | POA: Diagnosis not present

## 2024-03-20 DIAGNOSIS — M79671 Pain in right foot: Secondary | ICD-10-CM | POA: Diagnosis not present

## 2024-03-20 DIAGNOSIS — S92414A Nondisplaced fracture of proximal phalanx of right great toe, initial encounter for closed fracture: Secondary | ICD-10-CM | POA: Diagnosis not present

## 2024-03-20 DIAGNOSIS — W2209XA Striking against other stationary object, initial encounter: Secondary | ICD-10-CM | POA: Diagnosis not present

## 2024-04-05 DIAGNOSIS — S92414A Nondisplaced fracture of proximal phalanx of right great toe, initial encounter for closed fracture: Secondary | ICD-10-CM | POA: Diagnosis not present

## 2024-05-28 ENCOUNTER — Telehealth: Payer: Self-pay

## 2024-05-28 NOTE — Telephone Encounter (Signed)
 Spoke with Ms Adam Nichols advised we will need a medical release form for medical records to release information.

## 2024-05-28 NOTE — Telephone Encounter (Signed)
 Copied from CRM [id number]. Topic: Medical Record Request - Records Request >> May 28, 2024 11:37 AM Larissa RAMAN wrote: Reason for CRM: Lauree Ober requesting medical records be sent to [facility] [place] Email: [email or web address] [phone]

## 2024-06-26 ENCOUNTER — Telehealth: Payer: Self-pay

## 2024-06-26 NOTE — Telephone Encounter (Signed)
 noted

## 2024-06-26 NOTE — Telephone Encounter (Signed)
 Copied from CRM #[id number]. Topic: General - Other >> Jun 26, 2024  2:02 PM Jasmin G wrote: Reason for CRM: Pt will upload forms that need to signed by PCP or one of PCP's nurses through MyChart, please complete at your earliest convenience.

## 2024-07-27 ENCOUNTER — Ambulatory Visit: Payer: Self-pay

## 2024-07-31 ENCOUNTER — Encounter: Payer: Self-pay | Admitting: Internal Medicine

## 2024-07-31 ENCOUNTER — Ambulatory Visit (INDEPENDENT_AMBULATORY_CARE_PROVIDER_SITE_OTHER): Payer: Self-pay

## 2024-07-31 DIAGNOSIS — Z23 Encounter for immunization: Secondary | ICD-10-CM

## 2024-08-27 ENCOUNTER — Encounter: Admitting: Internal Medicine

## 2024-10-03 ENCOUNTER — Encounter: Payer: Self-pay | Admitting: Internal Medicine

## 2024-10-03 ENCOUNTER — Ambulatory Visit (INDEPENDENT_AMBULATORY_CARE_PROVIDER_SITE_OTHER): Admitting: Internal Medicine

## 2024-10-03 VITALS — BP 104/66 | HR 91 | Ht 68.0 in | Wt 169.4 lb

## 2024-10-03 DIAGNOSIS — F802 Mixed receptive-expressive language disorder: Secondary | ICD-10-CM

## 2024-10-03 DIAGNOSIS — F840 Autistic disorder: Secondary | ICD-10-CM | POA: Diagnosis not present

## 2024-10-03 DIAGNOSIS — Z114 Encounter for screening for human immunodeficiency virus [HIV]: Secondary | ICD-10-CM | POA: Diagnosis not present

## 2024-10-03 DIAGNOSIS — Z1159 Encounter for screening for other viral diseases: Secondary | ICD-10-CM

## 2024-10-03 DIAGNOSIS — Z0001 Encounter for general adult medical examination with abnormal findings: Secondary | ICD-10-CM | POA: Insufficient documentation

## 2024-10-03 DIAGNOSIS — Z79899 Other long term (current) drug therapy: Secondary | ICD-10-CM

## 2024-10-03 NOTE — Assessment & Plan Note (Signed)
 Physical exam as documented. Fasting blood tests today.

## 2024-10-03 NOTE — Assessment & Plan Note (Signed)
 Has tried speech therapy in the past Currently followed by neuropsychiatry

## 2024-10-03 NOTE — Patient Instructions (Signed)
 Please continue to give medications as prescribed.  Please continue to follow high protein diet and ambulate as tolerated.

## 2024-10-03 NOTE — Progress Notes (Signed)
 Established Patient Office Visit  Subjective:  Patient ID: Adam Nichols, male    DOB: June 12, 1991  Age: 33 y.o. MRN: [id number]  CC:  Chief Complaint  Patient presents with   Annual Exam    CPE / 6 MONTH F/U     HPI Adam Nichols is a 33 y.o. male with past medical history of autistic disorder who presents for annual physical.  He is followed by Dr. Oneil Herring currently. He takes Depakote , risperidone, gabapentin, clonidine and hydroxyzine  currently. He lives at [facility] currently. He is accompanied by his caregiver today. He has speech disorder, overall nonverbal. According to caregiver, he is independent with eating, bathing and clothing, but needs supervision. He has tendency to hold his right forearm with the left hand, but no apparent shaking or seizure-like activity noted today. No reports of agitation recently.   Forms from group home are filled and provided to the caregiver.  Past Medical History:  Diagnosis Date   Autism     History reviewed. No pertinent surgical history.  Family History  Problem Relation Age of Onset   Autism Brother     Social History   Socioeconomic History   Marital status: Single    Spouse name: Not on file   Number of children: Not on file   Years of education: Not on file   Highest education level: Not on file  Occupational History   Not on file  Tobacco Use   Smoking status: Never   Smokeless tobacco: Not on file  Substance and Sexual Activity   Alcohol  use: No   Drug use: No   Sexual activity: Never  Other Topics Concern   Not on file  Social History Narrative   Not on file   Social Drivers of Health   Financial Resource Strain: Not on file  Food Insecurity: Not on file  Transportation Needs: Not on file  Physical Activity: Not on file  Stress: Not on file  Social Connections: Not on file  Intimate Partner Violence: Not on file    Outpatient Medications Prior to Visit  Medication Sig Dispense  Refill   benztropine  (COGENTIN ) 1 MG tablet Take 1 mg by mouth 2 (two) times daily.     cloNIDine (CATAPRES) 0.1 MG tablet Take 0.1 mg by mouth 3 (three) times daily.     divalproex  (DEPAKOTE  ER) 250 MG 24 hr tablet Take 750 mg by mouth at bedtime.     gabapentin (NEURONTIN) 100 MG capsule Take 100 mg by mouth daily.     hydrOXYzine  (ATARAX ) 25 MG tablet Take 25 mg by mouth 2 (two) times daily.     RISPERDAL 1 MG tablet Take 1 mg by mouth as needed.     risperiDONE (RISPERDAL) 3 MG tablet Take 3 mg by mouth in the morning, at noon, and at bedtime.     No facility-administered medications prior to visit.    No Known Allergies  ROS Review of Systems  Constitutional:  Negative for chills and fever.  HENT:  Negative for congestion and sore throat.   Eyes:  Negative for pain and discharge.  Respiratory:  Negative for cough and shortness of breath.   Cardiovascular:  Negative for chest pain and palpitations.  Gastrointestinal:  Negative for diarrhea, nausea and vomiting.  Endocrine: Negative for polydipsia and polyuria.  Genitourinary:  Negative for dysuria and hematuria.  Musculoskeletal:  Negative for neck pain and neck stiffness.  Skin:  Negative for rash.  Neurological:  Negative for dizziness, weakness, numbness and headaches.  Psychiatric/Behavioral:  Negative for agitation. The patient is nervous/anxious.       Objective:    Physical Exam Vitals reviewed.  Constitutional:      General: He is not in acute distress.    Appearance: He is not diaphoretic.  HENT:     Head: Normocephalic and atraumatic.     Nose: Nose normal.     Mouth/Throat:     Mouth: Mucous membranes are moist.  Eyes:     General: No scleral icterus.    Extraocular Movements: Extraocular movements intact.  Cardiovascular:     Rate and Rhythm: Normal rate and regular rhythm.     Heart sounds: Normal heart sounds. No murmur heard. Pulmonary:     Breath sounds: Normal breath sounds. No wheezing or rales.   Abdominal:     Palpations: Abdomen is soft.     Tenderness: There is no abdominal tenderness.  Musculoskeletal:     Cervical back: Neck supple. No tenderness.     Right lower leg: No edema.     Left lower leg: No edema.  Skin:    General: Skin is warm.     Findings: No rash.  Neurological:     General: No focal deficit present.     Mental Status: He is alert and oriented to person, place, and time.     Sensory: No sensory deficit.     Motor: Weakness (B/l UE - 4/5) present.  Psychiatric:        Mood and Affect: Mood is anxious.        Behavior: Behavior is cooperative.     BP 104/66   Pulse 91   Ht 5' 8 (1.727 m)   Wt 169 lb 6.4 oz (76.8 kg)   SpO2 97%   BMI 25.76 kg/m  Wt Readings from Last 3 Encounters:  10/03/24 169 lb 6.4 oz (76.8 kg)  02/23/24 167 lb (75.8 kg)  09/17/13 185 lb (83.9 kg)    No results found for: TSH Lab Results  Component Value Date   WBC 7.0 10/02/2013   HGB 14.1 10/02/2013   HCT 41.0 10/02/2013   MCV 89.1 10/02/2013   PLT 258 10/02/2013   Lab Results  Component Value Date   NA 143 10/02/2013   K 3.6 10/02/2013   CO2 27 10/02/2013   GLUCOSE 79 10/02/2013   BUN 13 10/02/2013   CREATININE 1.03 10/02/2013   BILITOT 0.3 09/17/2013   ALKPHOS 106 09/17/2013   AST 30 09/17/2013   ALT 22 09/17/2013   PROT 7.9 09/17/2013   ALBUMIN 4.4 09/17/2013   CALCIUM 9.4 10/02/2013   No results found for: CHOL No results found for: HDL No results found for: LDLCALC No results found for: TRIG No results found for: CHOLHDL No results found for: YHAJ8R    Assessment & Plan:   Problem List Items Addressed This Visit       Other   Autistic disorder   Followed by psychiatry -Dr. Oneil Herring On risperidone 3 mg 3 times daily, Depakote  750 mg nightly, clonidine 0.1 mg 3 times daily, hydroxyzine  25 mg twice daily and gabapentin 100 mg once daily On Cogentin  1 mg twice daily      Relevant Orders   CBC with Differential/Platelet    CMP14+EGFR   TSH   Receptive expressive language disorder   Has tried speech therapy in the past Currently followed by neuropsychiatry      Relevant Orders  CBC with Differential/Platelet   CMP14+EGFR   TSH   Encounter for general adult medical examination with abnormal findings - Primary   Physical exam as documented. Fasting blood tests today.      Other Visit Diagnoses       Screening for HIV (human immunodeficiency virus)       Relevant Orders   HIV antibody (with reflex)     Need for hepatitis C screening test       Relevant Orders   Hepatitis C Antibody     On valproic acid  therapy       Relevant Orders   Valproic Acid  level       No orders of the defined types were placed in this encounter.   Follow-up: Return in about 6 months (around 04/02/2025).    Suzzane MARLA Blanch, MD

## 2024-10-03 NOTE — Assessment & Plan Note (Signed)
 Followed by psychiatry -Dr. Raelene Bullocks On risperidone 3 mg 3 times daily, Depakote 750 mg nightly, clonidine 0.1 mg 3 times daily, hydroxyzine 25 mg twice daily and gabapentin 100 mg once daily On Cogentin 1 mg twice daily

## 2024-10-05 LAB — CMP14+EGFR
ALT: 12 IU/L (ref 0–44)
AST: 19 IU/L (ref 0–40)
Albumin: 4.4 g/dL (ref 4.1–5.1)
Alkaline Phosphatase: 98 IU/L (ref 47–123)
BUN/Creatinine Ratio: 7 — ABNORMAL LOW (ref 9–20)
BUN: 7 mg/dL (ref 6–20)
Bilirubin Total: 0.6 mg/dL (ref 0.0–1.2)
CO2: 23 mmol/L (ref 20–29)
Calcium: 9.7 mg/dL (ref 8.7–10.2)
Chloride: 97 mmol/L (ref 96–106)
Creatinine, Ser: 1 mg/dL (ref 0.76–1.27)
Globulin, Total: 2.9 g/dL (ref 1.5–4.5)
Glucose: 73 mg/dL (ref 70–99)
Potassium: 4.5 mmol/L (ref 3.5–5.2)
Sodium: 133 mmol/L — ABNORMAL LOW (ref 134–144)
Total Protein: 7.3 g/dL (ref 6.0–8.5)
eGFR: 103 mL/min/1.73 (ref >59)

## 2024-10-05 LAB — CBC WITH DIFFERENTIAL/PLATELET
Basophils Absolute: 0 x10E3/uL (ref 0.0–0.2)
Basos: 0 %
EOS (ABSOLUTE): 0.2 x10E3/uL (ref 0.0–0.4)
Eos: 4 %
Hematocrit: 43.8 % (ref 37.5–51.0)
Hemoglobin: 14.8 g/dL (ref 13.0–17.7)
Immature Grans (Abs): 0 x10E3/uL (ref 0.0–0.1)
Immature Granulocytes: 0 %
Lymphocytes Absolute: 1.4 x10E3/uL (ref 0.7–3.1)
Lymphs: 27 %
MCH: 31.2 pg (ref 26.6–33.0)
MCHC: 33.8 g/dL (ref 31.5–35.7)
MCV: 92 fL (ref 79–97)
Monocytes Absolute: 0.7 x10E3/uL (ref 0.1–0.9)
Monocytes: 13 %
Neutrophils Absolute: 2.9 x10E3/uL (ref 1.4–7.0)
Neutrophils: 56 %
Platelets: 252 x10E3/uL (ref 150–450)
RBC: 4.74 x10E6/uL (ref 4.14–5.80)
RDW: 13 % (ref 11.6–15.4)
WBC: 5.2 x10E3/uL (ref 3.4–10.8)

## 2024-10-05 LAB — HEPATITIS C ANTIBODY: Hep C Virus Ab: NONREACTIVE

## 2024-10-05 LAB — VALPROIC ACID LEVEL: Valproic Acid Lvl: 87 ug/mL (ref 50–100)

## 2024-10-05 LAB — HIV ANTIBODY (ROUTINE TESTING W REFLEX): HIV Screen 4th Generation wRfx: NONREACTIVE

## 2024-10-05 LAB — TSH: TSH: 2.17 u[IU]/mL (ref 0.450–4.500)

## 2024-10-08 ENCOUNTER — Ambulatory Visit: Payer: Self-pay | Admitting: Internal Medicine

## 2024-11-30 ENCOUNTER — Ambulatory Visit

## 2024-12-26 ENCOUNTER — Ambulatory Visit: Payer: Self-pay

## 2024-12-28 ENCOUNTER — Ambulatory Visit

## 2025-04-02 ENCOUNTER — Ambulatory Visit: Admitting: Internal Medicine
